# Patient Record
Sex: Male | Born: 1960
Health system: Western US, Academic
[De-identification: ages and names within clinical notes are randomized; demographics above are authoritative.]

---

## 2018-02-02 ENCOUNTER — Ambulatory Visit

## 2018-02-02 DIAGNOSIS — C61 Malignant neoplasm of prostate: Secondary | ICD-10-CM

## 2018-02-03 DIAGNOSIS — R972 Elevated prostate specific antigen [PSA]: Secondary | ICD-10-CM

## 2018-02-03 DIAGNOSIS — E291 Testicular hypofunction: Secondary | ICD-10-CM

## 2018-02-03 DIAGNOSIS — N400 Enlarged prostate without lower urinary tract symptoms: Secondary | ICD-10-CM

## 2018-02-03 NOTE — H&P
DATE OF SERVICE:  02/03/2018      Donald Perkins seeks a second opinion regarding multiple urologic issues. The patient has multiple issues that are under consideration. The main issue he is here for is elevated PSA. His PSA a year ago was 1.4. It did jump to 3.5 and then 6.5. There is no family history of prostate cancer. This occurs in the setting of significant obstructive uropathy and has been evaluated in [facility] with cystoscopy which shows trilobar hypertrophy of the prostate and a significant median lobe with moderate trabeculation of the bladder. His AUA symptom score was calculated at 28. He has been treated with a variety of medications for this including Myrbetriq and tamsulosin and terazosin. He is currently on tamsulosin. The remainder of his urologic history includes being treated for hypogonadism, although it is not clear what his initial testosterone levels were. He had been taking testosterone placement, so it coincided with his rising PSA. He has now stopped it and so were not really clear where we stand on this issue. He has also had a vasectomy in the past.    PAST MEDICAL HISTORY:  Reveals mild hypertension.    MEDICATIONS:  His current medications include Bystolic, tamsulosin, and baby aspirin.    PAST SURGICAL HISTORY:  Previous surgeries all related orthopedic injuries including a back fusion, multiple shoulder surgeries and knee surgery.    ALLERGIES:  He denies drug allergies.    SOCIAL HISTORY:  He works as a Corporate treasurer, is divorced with 2 children, and lives in [address].    REVIEW OF SYSTEMS:  His 14-point system review is noncontributory to the present illness.    PHYSICAL EXAMINATION:  He is a very physically fit muscular male with no acute distress. Vital Signs: Normal. Head, Ears, Nose and Throat: Negative. Chest: Clear to P and A. Heart: Regular sinus rhythm. Abdomen: Soft without masses. Groins and Genitalia: Normal male and prostate is symmetrical, slightly enlarged and benign. Back and Extremities, Neurologic: Intact.    IMPRESSION:  BPH with elevated prostate-specific antigen. History of male hypogonadism.    PLAN:  We had a long discussion about his PSA issues which could reflect changes in his hormone status and I told him that it would be interesting to repeat his PSA now that he is off the testosterone replacement and see whether it normalizes.  Because of the patient's significant concern about prostate cancer, I thought the interim proposal would be to do a prostate MRI which could give [place] some indication of the size and the configuration of his prostate, as well as to look for any targetable lesions which might trigger a biopsy. He is comfortable with proceeding with this and holding the biopsy decision until after repeat PSA is done or until some abnormality was seen on his MRI, he will withhold. He will continue his tamsulosin for the present time and withhold testosterone replacement for the present. We also discussed his significant obstructive uropathy. The fact that medication, medical therapy does not usually work well in patients with a median lobe hypertrophy and we will consider this issue as possible for local prostatic obstructive relief after we resolve the question of his elevated PSA. He will proceed with the MRI and we will see him and discuss results afterwards.      Gwendalyn Ege, MD [phone])        SH/MODL CONF#: [id number]  D: 02/03/2018 10:59:10 T: 02/03/2018 11:26:15 DOCUMENT: [id number]

## 2018-02-06 ENCOUNTER — Ambulatory Visit

## 2018-02-06 ENCOUNTER — Telehealth

## 2018-02-06 DIAGNOSIS — R972 Elevated prostate specific antigen [PSA]: Secondary | ICD-10-CM

## 2018-02-06 NOTE — Telephone Encounter
Request labs from [facility] [id number]

## 2018-03-05 MED ADMIN — GADOBUTROL 1 MMOL/ML IV SOLN: 10 mL | INTRAVENOUS | Stop: 2018-03-05 | NDC 50419032512

## 2018-03-25 ENCOUNTER — Ambulatory Visit: Payer: PRIVATE HEALTH INSURANCE

## 2018-03-25 DIAGNOSIS — N400 Enlarged prostate without lower urinary tract symptoms: Secondary | ICD-10-CM

## 2018-03-25 DIAGNOSIS — R972 Elevated prostate specific antigen [PSA]: Secondary | ICD-10-CM

## 2018-03-25 MED ADMIN — LIDOCAINE 2 % EX GEL UROJET: 10 mL | URETHRAL | @ 19:00:00 | Stop: 2018-03-25 | NDC 25021067377

## 2018-03-25 MED ADMIN — GENTAMICIN 40 MG/ML SYRINGE (IM): 80 mg | INTRAMUSCULAR | @ 19:00:00 | Stop: 2018-03-25 | NDC 00409120703

## 2018-03-25 NOTE — Procedures
TITLE:  PROCEDURE NOTE    DATE OF PROCEDURE:  03/25/2018         PREPROCEDURE DIAGNOSIS:  BPH.    POSTPROCEDURE DIAGNOSIS:  BPH.    TYPE OF PROCEDURE:  Cystourethroscopy.    ASSISTANT:  None.    ANESTHESIA:  Topical anesthesia.    INDICATIONS:  This is a 57 year old male who has been evaluated for both hypogonadism, elevated PSA, and obstructive BPH. His testosterone is at good levels with replacement. His MRI suggests no targetable or suspicious areas for prostate cancer and a prostate of 45 g. He has moderate obstructive uropathy symptoms.    FINDINGS:  There was moderately enlarged prostate with no significant median lobe component. Minimal trabeculation of the bladder.    DESCRIPTION OF PROCEDURE:  With the patient in supine position on the cysto table, the penis was prepared and draped in standard technique. Intraurethral lidocaine was used, and the Storz flexible scope was inserted in retrograde fashion. The bladder, bladder neck, and urethra were examined under direct vision. The bladder was of normal capacity, normally distensible, without significant trabeculation. The trigone was well developed. There was moderate trilobar hypertrophy of the prostate with a relatively short prostatic urethra. No lesions being seen, the anterior urethra was normal, and the scope was removed. The bladder was drained. Prophylactic gentamicin was given, and the patient was taken to the holding area having tolerated the procedure well. The patient is a reasonable candidate for transurethral prostatectomy or other transurethral procedure for relief of   BPH symptoms. We will discuss various options with him and make appropriate referral.      Christinia Gully, MD [phone])        SH/MODL CONF#: [id number]  D: 03/25/2018 10:57:24 T: 03/25/2018 13:15:12 DOCUMENT: [id number]

## 2018-03-25 NOTE — Interdisciplinary
Patient states that oral sedation was not taken before coming in for a procedure. Designated driver is n/a and can be reached at n/a

## 2018-07-24 ENCOUNTER — Ambulatory Visit: Payer: PRIVATE HEALTH INSURANCE

## 2018-07-24 DIAGNOSIS — R972 Elevated prostate specific antigen [PSA]: Secondary | ICD-10-CM

## 2018-07-27 ENCOUNTER — Ambulatory Visit: Payer: PRIVATE HEALTH INSURANCE

## 2018-08-12 ENCOUNTER — Ambulatory Visit: Payer: PRIVATE HEALTH INSURANCE

## 2018-09-02 ENCOUNTER — Ambulatory Visit: Payer: PRIVATE HEALTH INSURANCE

## 2018-11-05 ENCOUNTER — Ambulatory Visit: Payer: PRIVATE HEALTH INSURANCE

## 2018-11-05 DIAGNOSIS — R972 Elevated prostate specific antigen [PSA]: Secondary | ICD-10-CM

## 2018-11-16 ENCOUNTER — Telehealth: Payer: PRIVATE HEALTH INSURANCE

## 2018-11-16 ENCOUNTER — Ambulatory Visit: Payer: PRIVATE HEALTH INSURANCE

## 2018-11-16 NOTE — H&P
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 Full evaluation including cystoscopy, revealed a significantly obstructive uropathy with elevated residual urine. After the completion of the evaluation, it was recommended that he either go on full medical therapy or have a transurethral procedure to relieve outlet obstruction. The patient chose to continue taking tamsulosin and was also taking supplementary testosterone. Recently, his PSA was noted to be 1.5 and his testosterone level to be 1099. The patient on Telemedicine consult today reveals that he is having quite significant obstructive uropathic symptoms with multi-time nocturia. He is not particularly reliable in taking his medications and takes them somewhat irregularly and in the wrong sequence. He was surprised to know that his testosterone level was so high. The bottom line was we had a long discussion. I told him that he should cut way back on his testosterone supplementation. He should continue taking tamsulosin and he should probably also go on finasteride, which he declines to do. I do think that he would benefit from interventional volume reduction of his prostate and then he would not have to worry so much about his medications. He was also relieved to note that his PSA has been 1.5 on 2 separate occasions. I have taken the liberty to refer him to my colleague, Dr. Geryl Councilman, for consideration of prostatectomy and he wishes to do a video consultation, which I will attempt to arrange.      Christinia Gully, MD [phone])        SH/MODL CONF#: [id number]  D: 11/16/2018 13:59:05 T: 11/16/2018 14:48:03 DOCUMENT: [id number]

## 2018-11-16 NOTE — Telephone Encounter
Hello,     Dr. Barnet Pall would like to refer this pt to see Dr. Geryl Councilman for TURP.     Thank you.

## 2018-11-17 NOTE — Telephone Encounter
LVM to schedule pt for NP video Sx consult with Dr. Geryl Councilman

## 2018-11-23 ENCOUNTER — Telehealth: Payer: PRIVATE HEALTH INSURANCE | Attending: Surgical Oncology

## 2018-11-23 MED ORDER — TAMSULOSIN HCL 0.4 MG PO CAPS
.4 mg | ORAL_CAPSULE | Freq: Two times a day (BID) | ORAL | 3 refills | Status: AC
Start: 2018-11-23 — End: ?

## 2018-11-25 NOTE — Progress Notes
58 year old patient of Dr. Barnet Pall.     History of potentially elevated PSA but recent 1.5 and normal DRE.     Has obstructive LUTS on tamsulosin 0.4 mg. N2-4x, urge, occasional UI. 45 cc on pMRI, trilobar hypertrophy on cystoscopy by Dr. Barnet Pall. PVR reportedly low. Symptoms progressively worse last 6-8 years.     Sexually active has younger gf. Divorced with two children. Sexual function is important to him.    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 Last 1099.    Impression:  1. 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 T replacement for hypogonadism  3. Prior vasectomy.  4. cialis prn    Discussion:  1. We reviewed and discussed options including maximal medical therapy vs surgery which would include TURP. We discussed risks, benefits, and typical course of TURP. 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  2. At this point, the patient has not been consistently taking medication. He is adverse to finasteride for concern of sexual function. We discussed that if he is compliant with medical therapy, to try tamsulosin bid and cialis 5 mg qd.   3. If wishes to stop medications and or worsening LUTS, to consider TURP.  4. Advised to decrease T replacement to keep at least high normal range  5. Will follow up prn

## 2018-12-02 ENCOUNTER — Ambulatory Visit: Payer: PRIVATE HEALTH INSURANCE

## 2019-09-02 ENCOUNTER — Ambulatory Visit: Payer: PRIVATE HEALTH INSURANCE

## 2019-09-02 DIAGNOSIS — Z23 Encounter for immunization: Secondary | ICD-10-CM

## 2019-09-16 ENCOUNTER — Ambulatory Visit: Payer: PRIVATE HEALTH INSURANCE

## 2019-09-16 DIAGNOSIS — R972 Elevated prostate specific antigen [PSA]: Secondary | ICD-10-CM

## 2019-09-22 ENCOUNTER — Telehealth: Payer: PRIVATE HEALTH INSURANCE

## 2019-09-22 DIAGNOSIS — N400 Enlarged prostate without lower urinary tract symptoms: Secondary | ICD-10-CM

## 2019-09-22 NOTE — Progress Notes
Note dictated

## 2019-09-22 NOTE — Telephone Encounter
PDL Call to Practice    Reason for Call: Pt called in and needed to speak to [facility] in regards to prescriptions discussed in his video visit today.  MD: Barnet Pall     Appointment Related?  []  Yes  [x]  No     If yes;  Date:  Time:    Call warm transferred to PDL: [x]  Yes  []  No    Call Received by Practice Representative:  Colletta [place]

## 2019-09-22 NOTE — H&P
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 He was initially referred for elevated PSA, which, in fact, he did not have and does not have at the present time. His current PSA is 1.5 and his testosterone level is 666. He has been treated intermittently for obstructive uropathy with tamsulosin, which, when he takes it, has great results. He was referred for possible surgery, but it was felt that if he complied with his medications, he would not need surgery at this time, an opinion with which I agree. We had a long talk about taking the medication and complying with it. He will attempt to do so. He is apparently moving to the [facility] and will establish urological care when he gets there. He will return here p.r.n.      Christinia Gully, MD [phone])        SH/MODL CONF#: [id number]  D: 09/22/2019 12:38:52 T: 09/22/2019 12:54:59 DOCUMENT: [id number]

## 2019-12-11 MED ORDER — TADALAFIL 5 MG PO TABS
ORAL_TABLET | 5 refills
Start: 2019-12-11 — End: ?

## 2019-12-18 MED ORDER — TADALAFIL 5 MG PO TABS
ORAL_TABLET | 5 refills | Status: AC
Start: 2019-12-18 — End: ?

## 2021-08-11 IMAGING — CT CT ABD/PEL W CONT
2 of 5 series · 13 of 46 positions shown, 15 images · IV contrast (isovue)
Comparison: None

HISTORY/INDICATION:  Left lower quadrant abdominal pain
TECHNIQUE: Scans of the abdomen and pelvis are performed at 5mm increments, both with oral contrast and intravenous administration of 100 mL of Isovue 300 with coronal and sagittal reconstructions. I-STAT creatinine is 1.3 mg/dL. 

Dose reduction technique used: Automated exposure control and adjustment of the mA and/or kV according to patient size. CT studies and Cardiac Nuclear Medicine studies last 12 months = 0
Total radiation dose to patient is CTDIvol 29.29 mGy and DLP 921.60 mGy-cm.

[Series 4: soft tissue · axial · 0.66mm/px · z∈[+1247,+1687]mm · 10 of 100 slices shown, 12 images]
[im 6/100  soft-tissue]
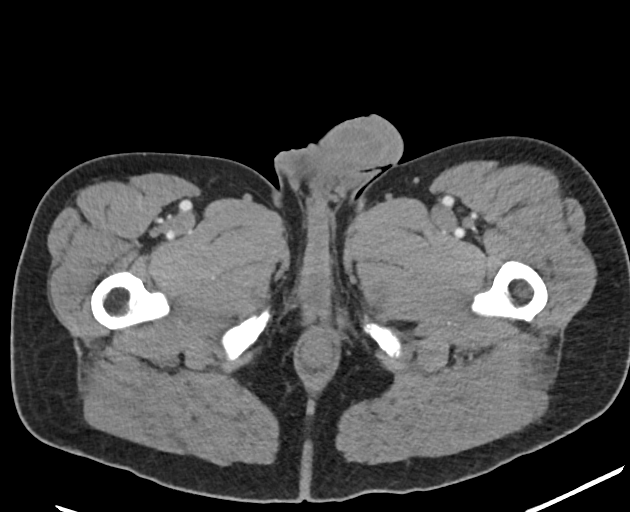
[im 6/100  bone]
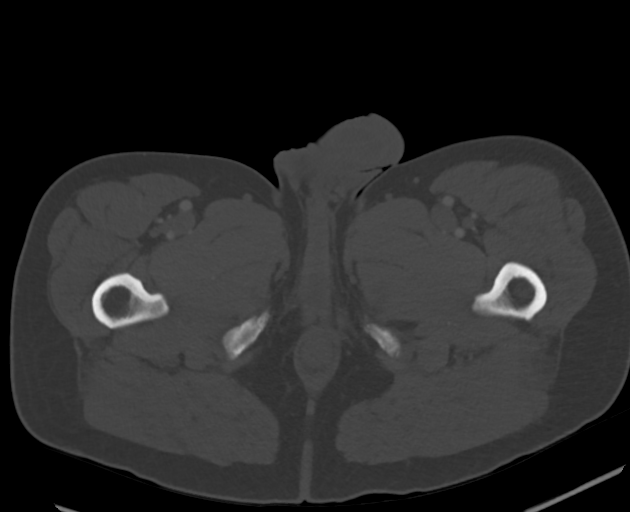
[im 16/100  soft-tissue]
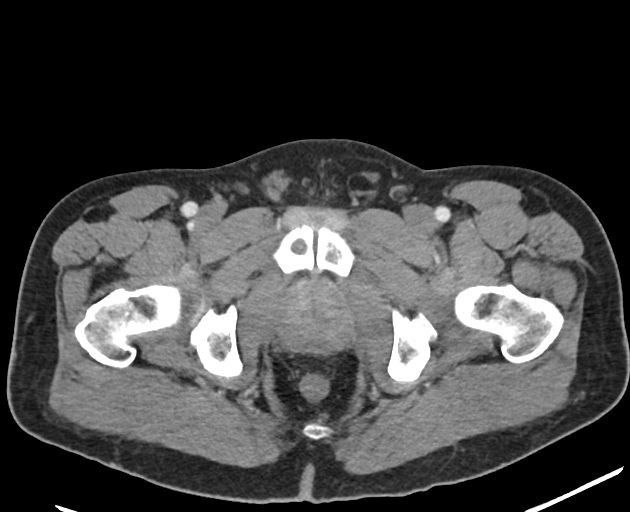
[im 27/100  soft-tissue]
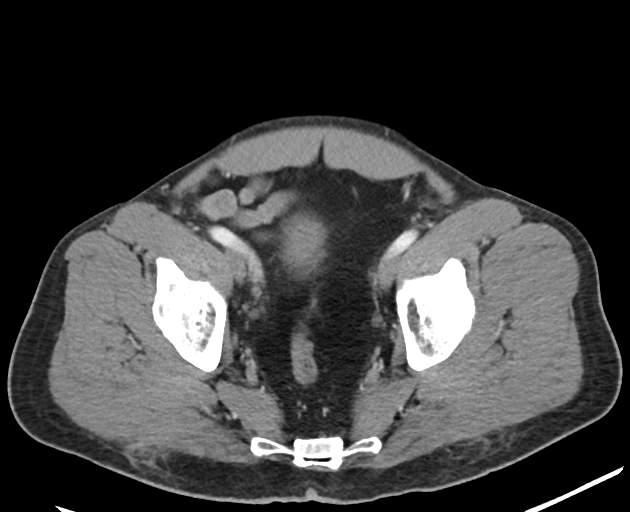
[im 37/100  soft-tissue]
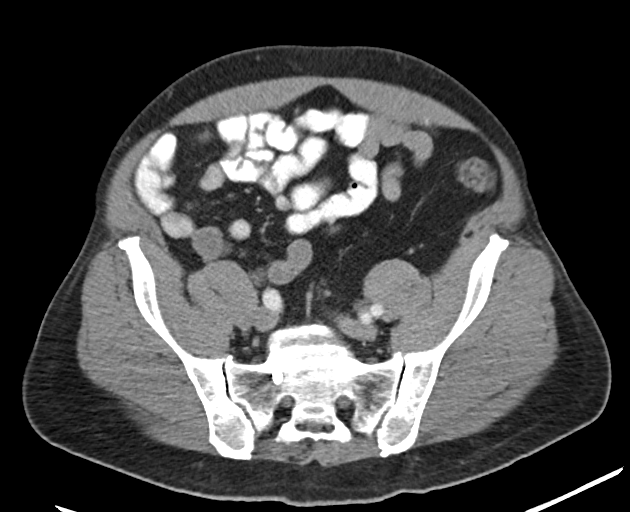
[im 47/100  soft-tissue]
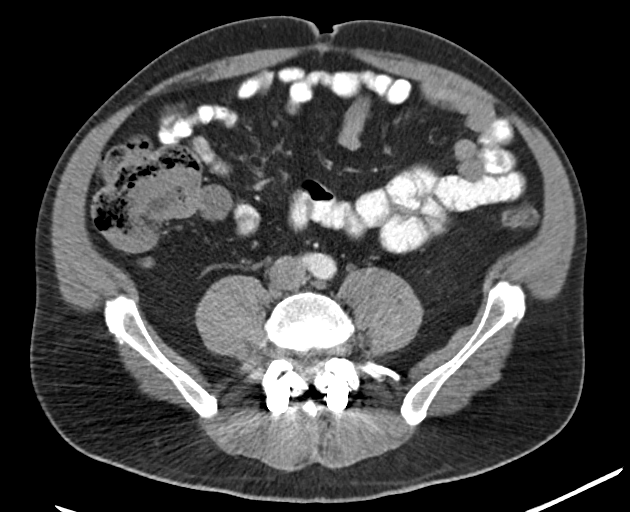
[im 53/100  soft-tissue]
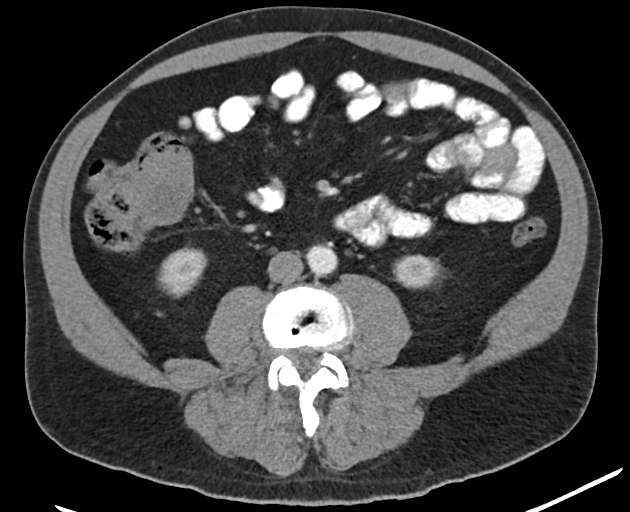
[im 63/100  soft-tissue]
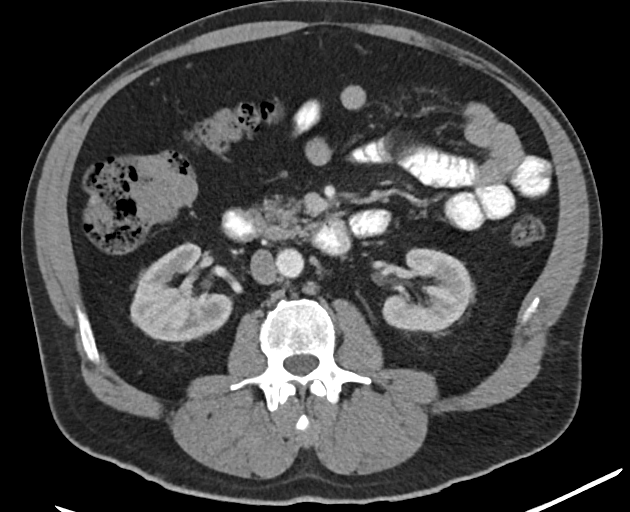
[im 73/100  soft-tissue]
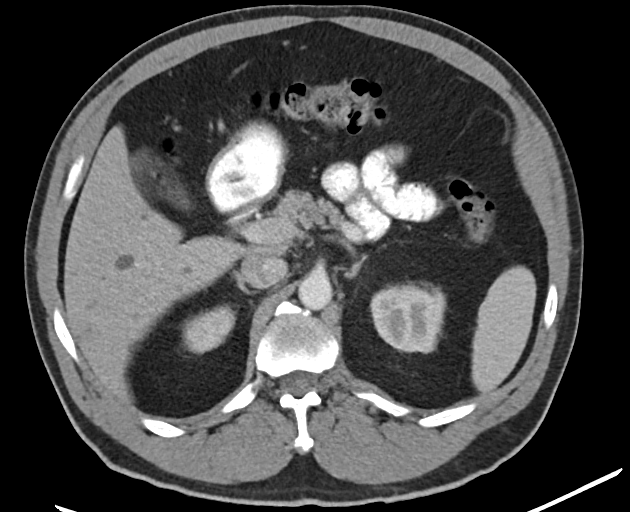
[im 84/100  soft-tissue]
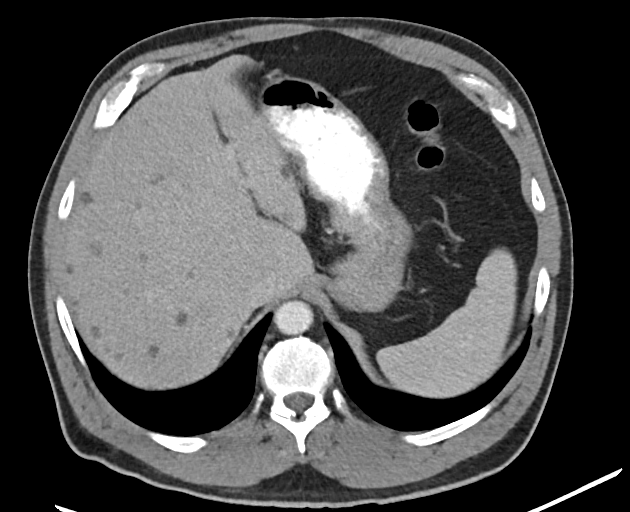
[im 84/100  bone]
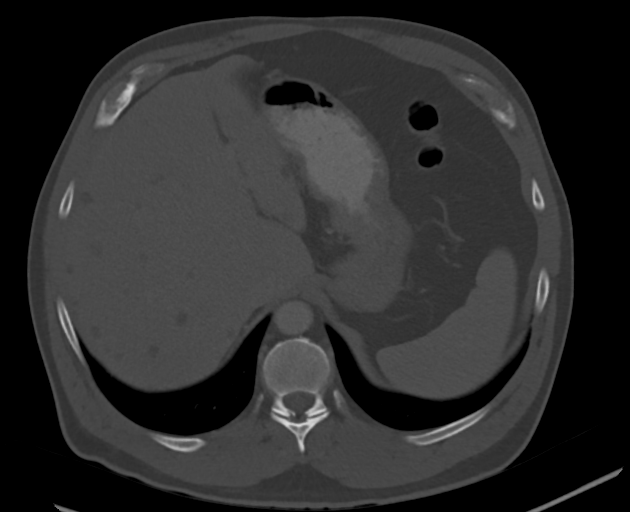
[im 94/100  soft-tissue]
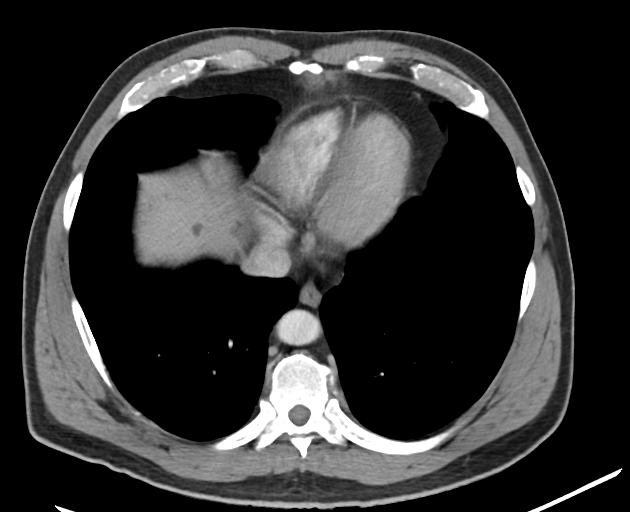

[Series 6: coronal · coronal · 0.81mm/px · 3 of 67 slices shown]
[im 23/67  soft-tissue]
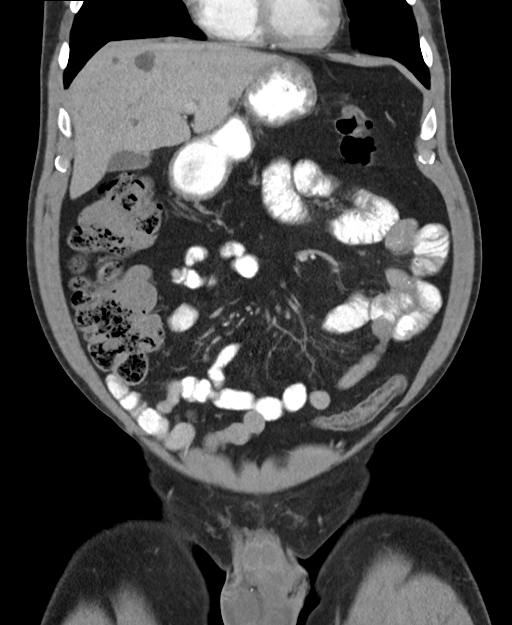
[im 30/67  soft-tissue]
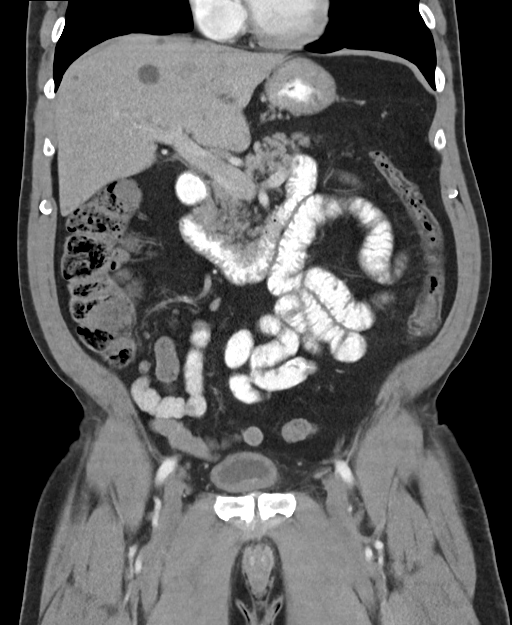
[im 37/67  soft-tissue]
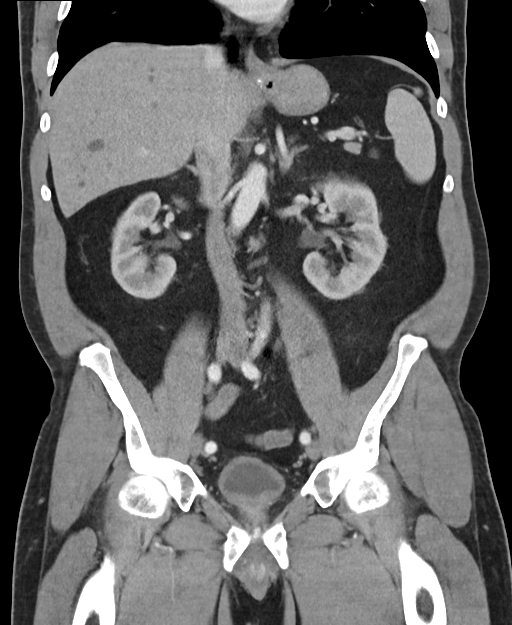

[13 of 46 positions shown; findings below may reference images not displayed]

FINDINGS: CT ABDOMEN: Lung bases are clear. Caudal margin of heart is normal. No hiatal hernia is present although postsurgical changes appear to be present at the gastroesophageal junction.

Numerous simple cysts are seen scattered uniformly through the liver, generally 5 mm or less in diameter although a segment VI cyst ([DATE]) measures 25 x 18 mm. No solid hepatic mass, fatty infiltration or cirrhosis is present. The gallbladder and biliary system, spleen, pancreas, adrenal glands and kidneys are normal. The periaortic and paracaval spaces are normal and no intraperitoneal findings are seen in the upper or midabdomen.

CT PELVIS: The right colon including terminal ileum and appendix is normal. There is a moderate amount of inspissated stool in the colon with scattered diverticula in the sigmoid colon with no evidence of diverticulitis or colitis. The right and left ureters are normal to the level of the bladder with no obstructing ureteral stone. Bladder wall is mildly thickened and there are postsurgical changes of TURP. No inguinal findings are present.

Coronal and sagittal reconstructions show no additional findings. Degenerative change is seen in disc spaces and facets of lower lumbar spine with bilateral L5 spondylolysis stabilized by pedicle screws and connecting rods between L5 and S1 without spondylolisthesis.
IMPRESSION: 1.
Numerous simple hepatic cysts.

2.
Moderate constipation with diverticulosis of sigmoid colon.

3.
Postop TURP with mild bladder wall thickening which should be correlated for cystitis.

4.
Bilateral L5 spondylolysis stabilized by pedicle screws and connecting rods between L5 and S1 without spondylolisthesis.

## 2021-11-24 IMAGING — MR MRI KNEE RT WO CONTRAST
4 of 5 series · 23 of 40 positions shown · non-contrast
Comparison: None.

Images Obtained from [facility]
INDICATION: Pain in right knee.
TECHNIQUE: Multiplanar, multiecho imaging of the right knee was performed, including T1-weighted and fluid sensitive sequences without intravenous contrast administration.

[Series 8: t2_axial_fs · axial · right · 3.0mm · 0.52mm/px · z∈[-25,+99]mm · 8 of 32 slices shown]
[im 1/32]
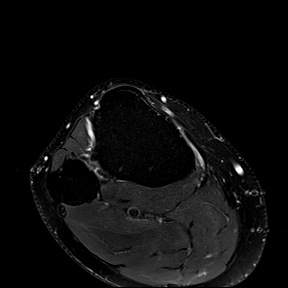
[im 5/32]
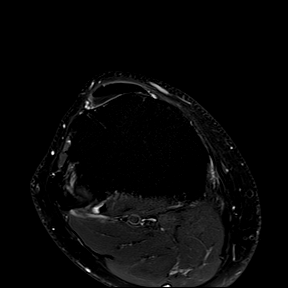
[im 9/32]
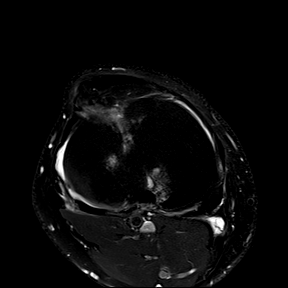
[im 14/32]
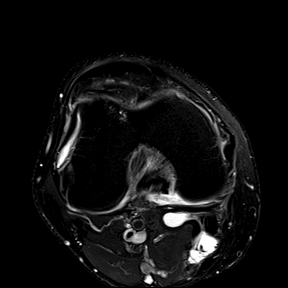
[im 18/32]
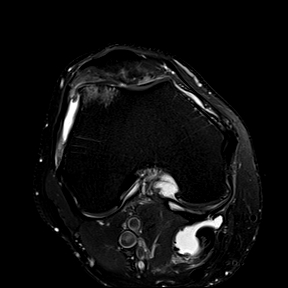
[im 23/32]
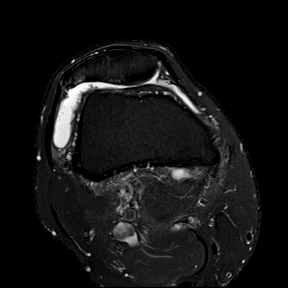
[im 27/32]
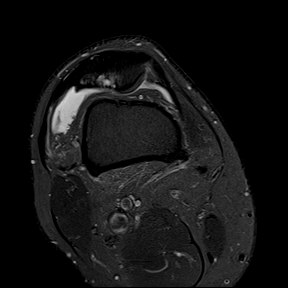
[im 32/32]
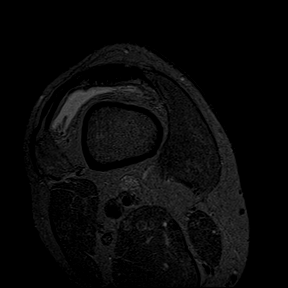

[Series 9: pd_sag_fs · sagittal · right · 3.0mm · 0.27mm/px · 8 of 30 slices shown]
[im 1/30]
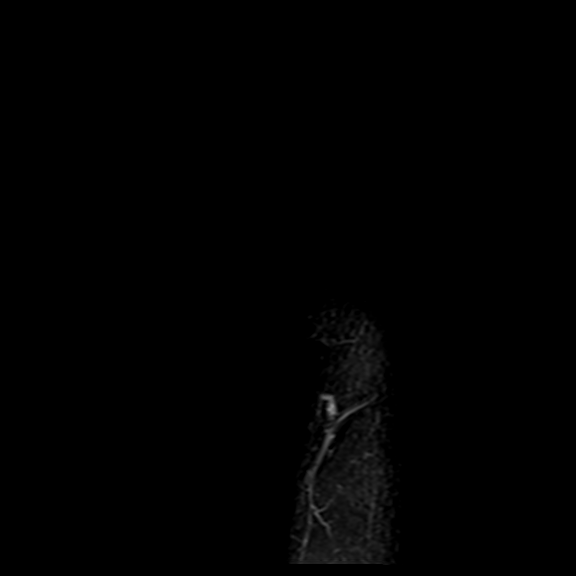
[im 5/30]
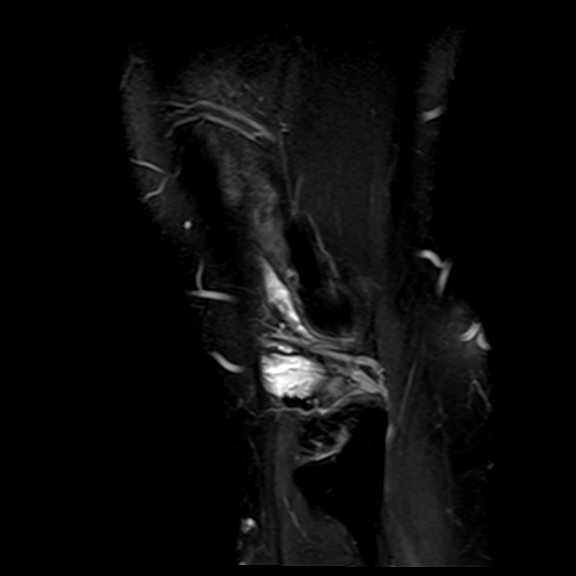
[im 9/30]
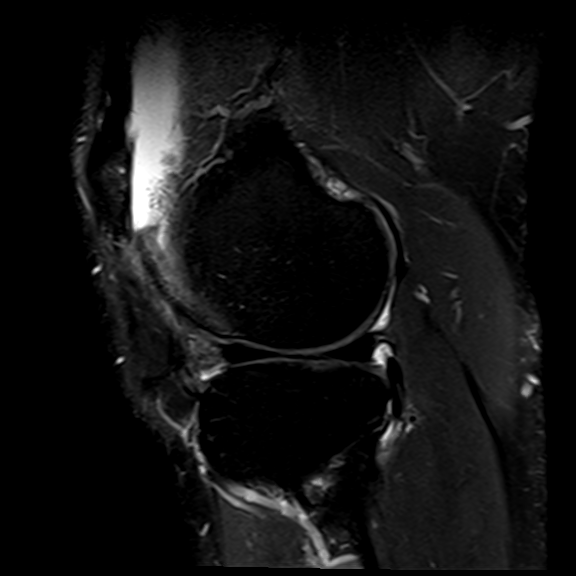
[im 13/30]
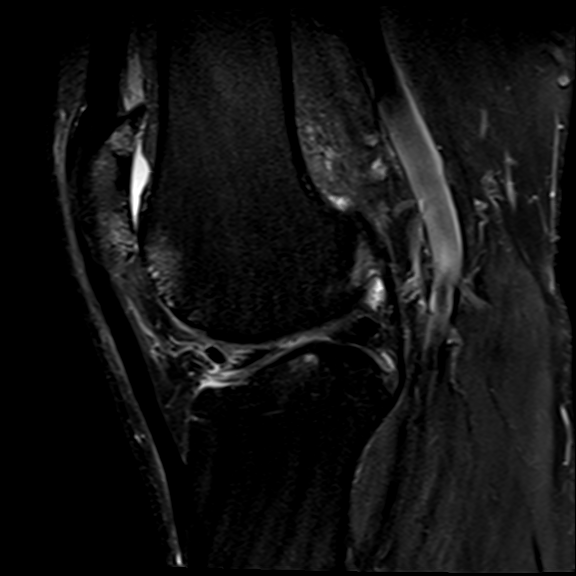
[im 17/30]
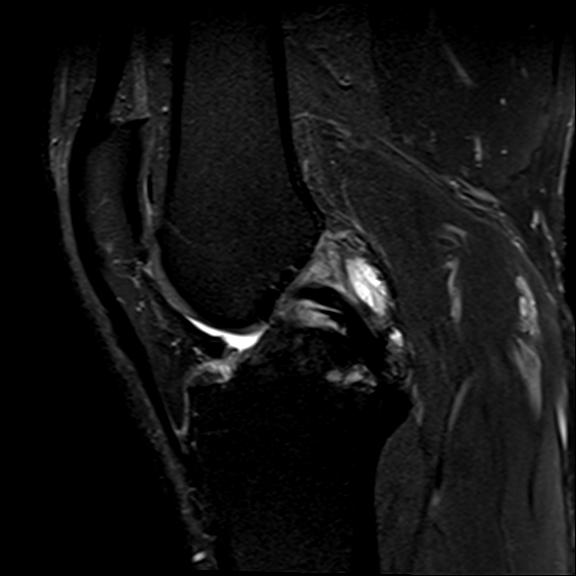
[im 21/30]
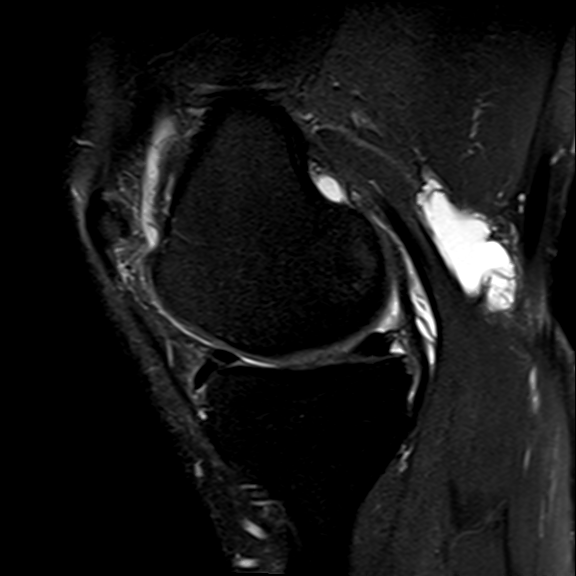
[im 25/30]
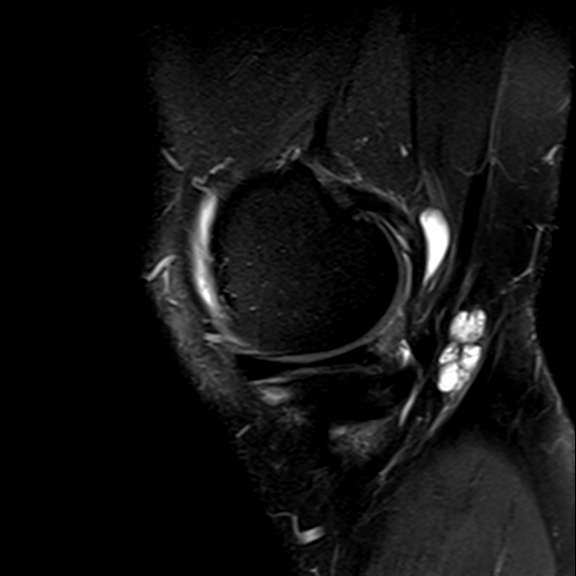
[im 30/30]
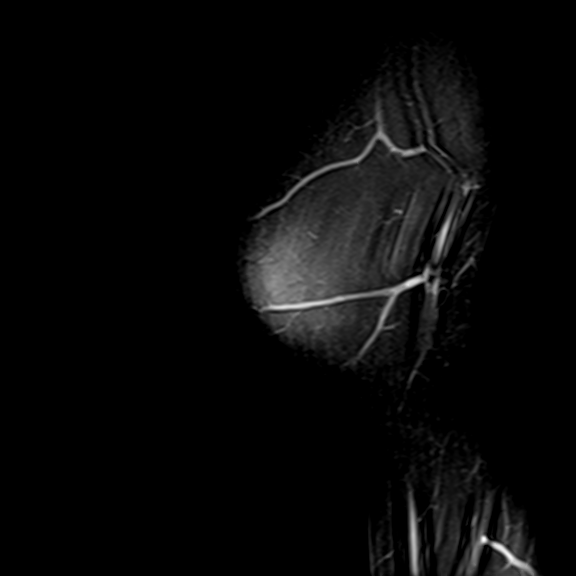

[Series 10: t2_sag_fs · sagittal · right · 3.0mm · 0.27mm/px · 4 of 30 slices shown]
[im 1/30]
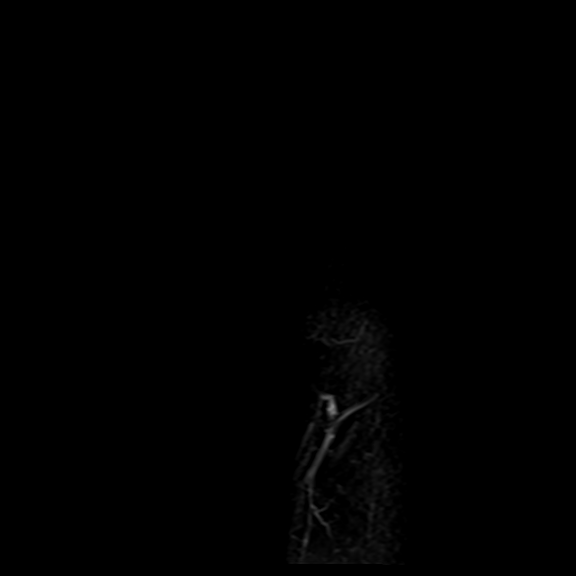
[im 5/30]
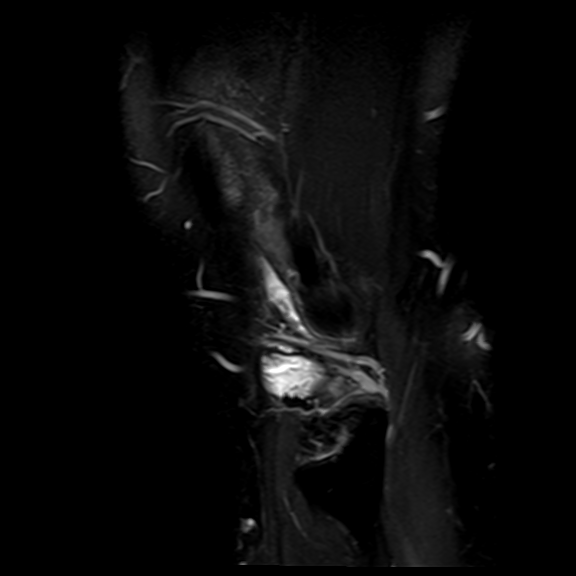
[im 17/30]
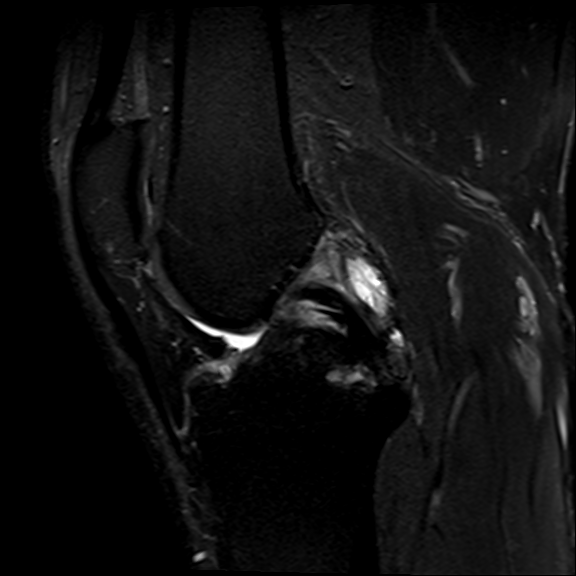
[im 25/30]
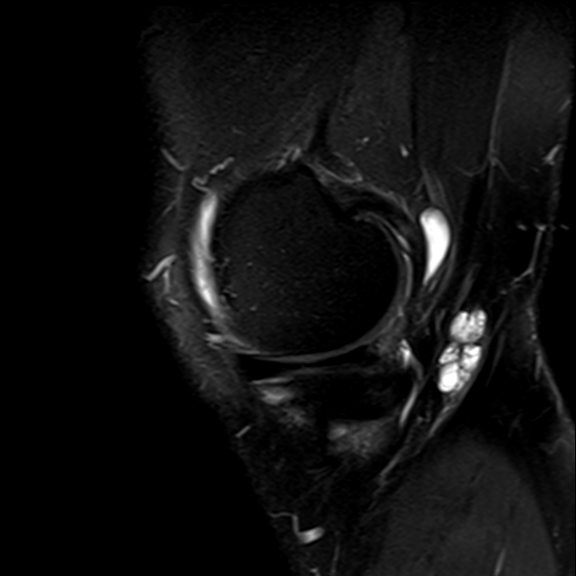

[Series 11: t1_cor · coronal · right · 3.5mm · 0.46mm/px · 3 of 30 slices shown]
[im 5/30]
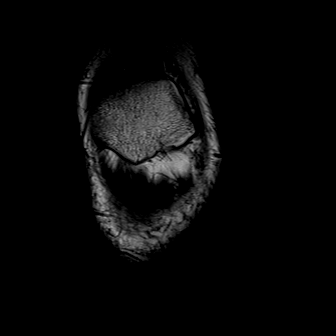
[im 17/30]
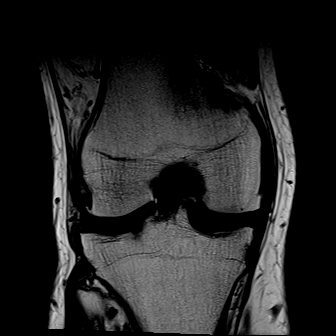
[im 25/30]
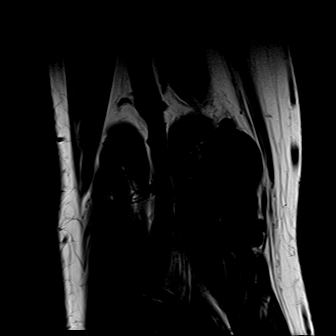

[23 of 40 positions shown; findings below may reference images not displayed]

FINDINGS: MEDIAL MENISCUS:  Moderate-sized radial tear at the junction of the posterior horn/root. Large oblique undersurface tear at the junction of the posterior horn and body with undersurface flap
extending into the inferior medial gutter.
LATERAL MENISCUS:  Subtle fraying at the free edge of the body.
ACL:  Intact.
PCL:  Intact.
MCL:  Chronic partial-thickness tear, at the proximal attachment.
LATERAL LIGAMENTS AND TENDONS:  Intact.
EXTENSOR MECHANISM:  The quadriceps and patellar tendons are intact.
FAT PADS:   Scar tissue and edema present in Hoffa's fat.
CARTILAGE:
Patellofemoral compartment:  Extensive full-thickness chondral loss at the lateral femoral trochlea and lateral patellar facet, as well as the patellar apex.
Medial compartment:  Scattered chondral surface irregularity and thinning. High-grade chondral loss at the inner aspect of the tibial plateau.
Lateral compartment: Large high-grade chondral loss at the inner aspect of the tibial plateau. Scattered chondral surface irregularity and thinning throughout the weightbearing femoral condyle.
BONE MARROW: Subchondral marrow edema, most pronounced at the anterior compartment. Small marginal osteophytes. No acute fracture. Mild subluxation laterally of the patella.
Moderate-sized Baker's cyst. Moderate-sized joint effusion with synovitis/debris.
IMPRESSION: 1.\X09\Moderate-sized radial tear at the junction of the posterior horn/root medial meniscus. Large oblique undersurface tear at the junction of the posterior horn and body of medial meniscus, with
undersurface flap extending into the inferior medial gutter.
2.\X09\Subtle fraying at the free edge of the lateral meniscus body.
3.\X09\Severe patellofemoral compartment chondral abnormalities.
4.\X09\Moderate medial and lateral compartment chondral abnormalities.
5.\X09\Chronic partial-thickness tear of the MCL, at the proximal attachment.

## 2021-12-14 IMAGING — CR CHEST 2 VWS PA LAT
1 series · 2 of 2 positions shown · non-contrast
Comparison: None

Images Obtained from [facility]
HISTORY: Encounter for other preprocedural examination
TECHNIQUE: PA and lateral chest radiographs

[Series 1: PA · 0.17mm/px · 2 of 2 slices shown]
[im 1/2]
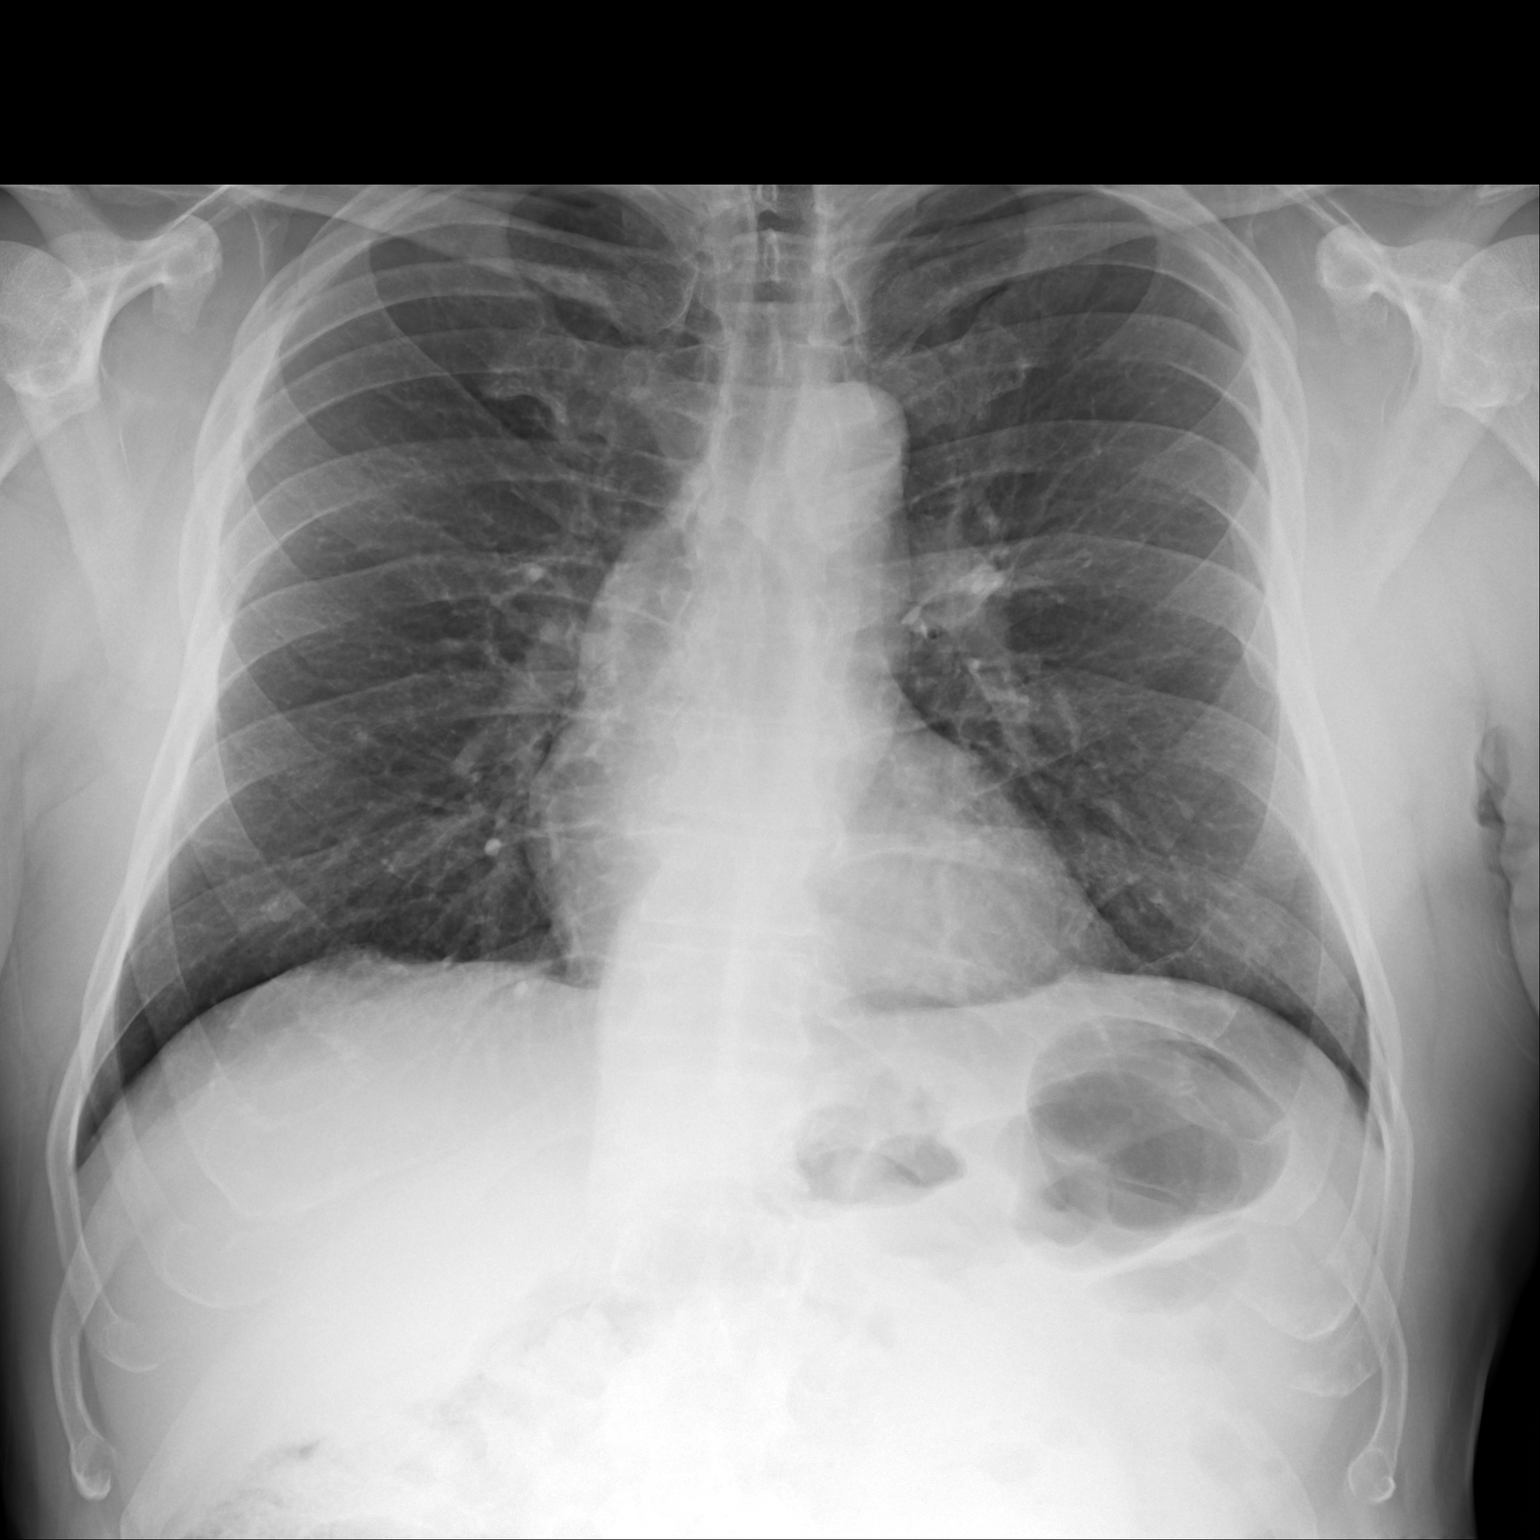
[im 2/2]
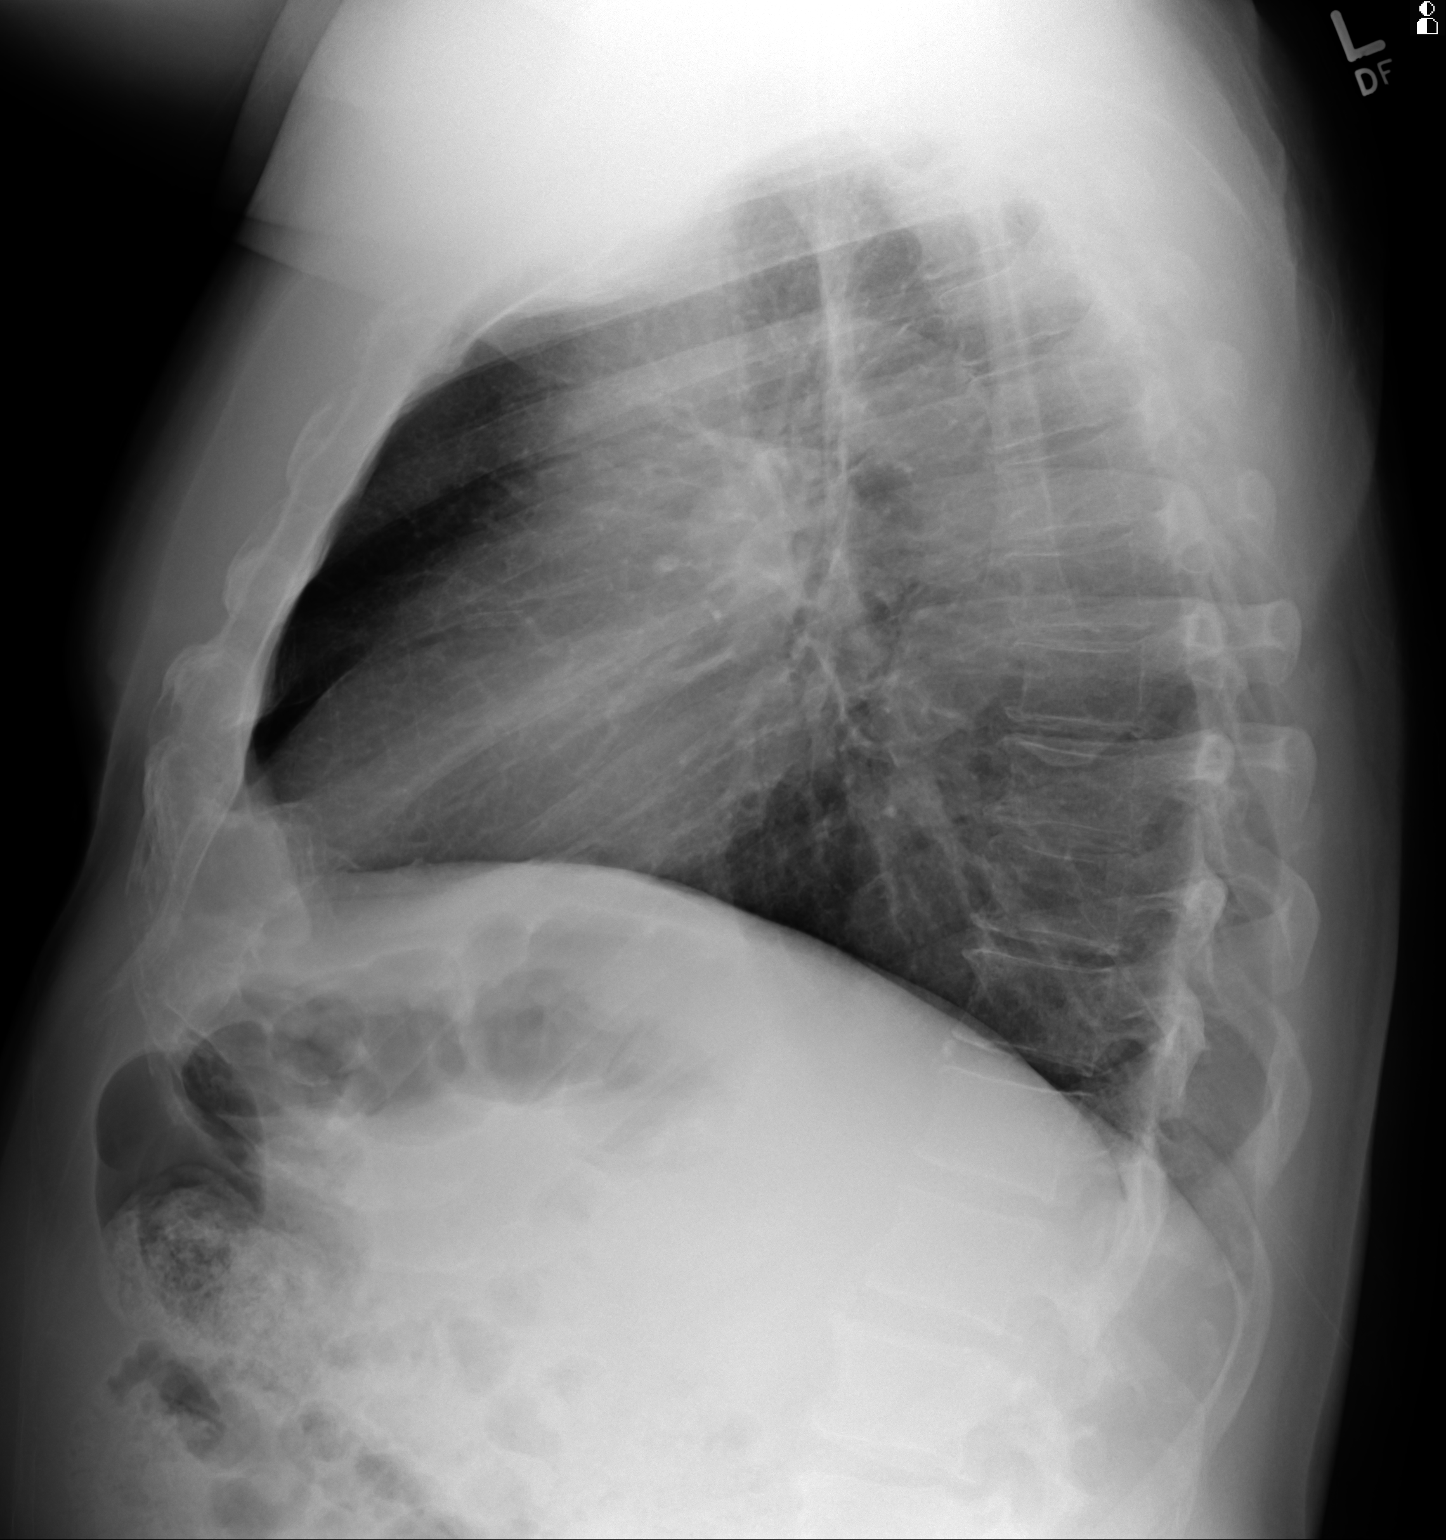

[2 of 2 positions shown; findings below may reference images not displayed]

FINDINGS: Heart size is normal. No pleural effusion or pneumothorax. No consolidation.
IMPRESSION: No acute chest findings.

## 2021-12-25 IMAGING — MR MRI LSPINE WO CONTRAST
4 of 5 series · 20 of 48 positions shown · non-contrast
Comparison: None.

Images Obtained from [facility]
HISTORY: 61-year-old male. Low back pain.  Right leg radiculopathy.
TECHNIQUE: Multiplanar, multi-sequential MR images of the lumbar spine were obtained without intravenous contrast.

[Series 16: t2_sag · sagittal · 4.0mm · 0.55mm/px · 6 of 16 slices shown]
[im 1/16]
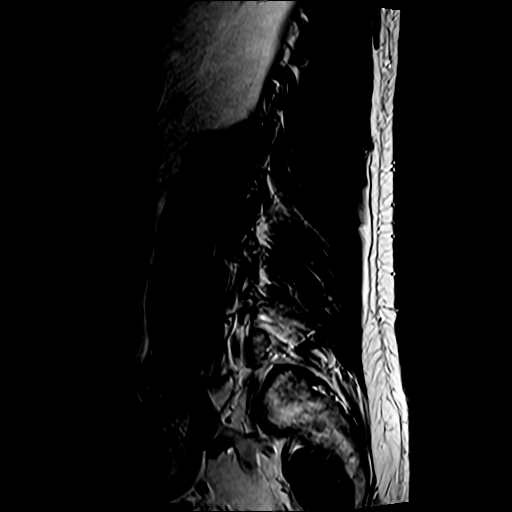
[im 4/16]
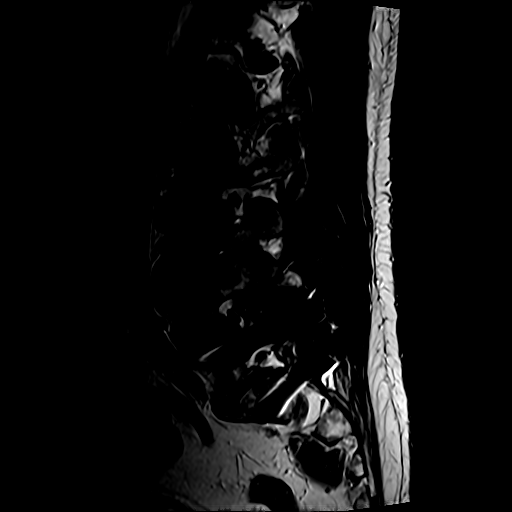
[im 7/16]
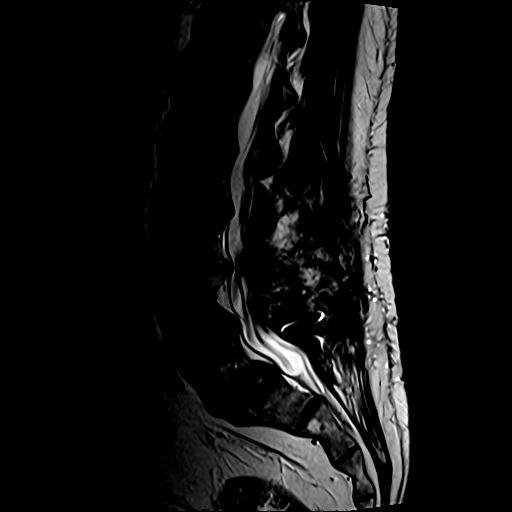
[im 10/16]
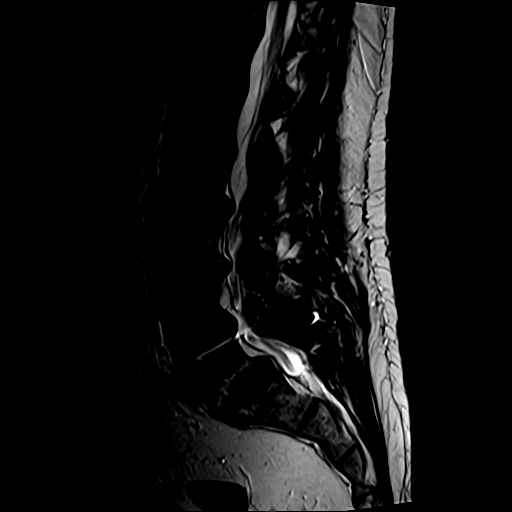
[im 13/16]
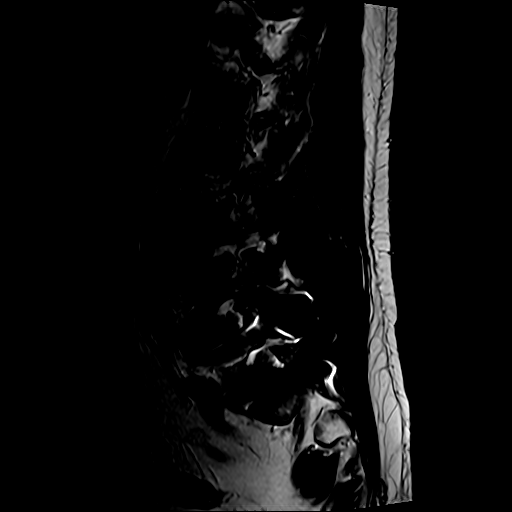
[im 16/16]
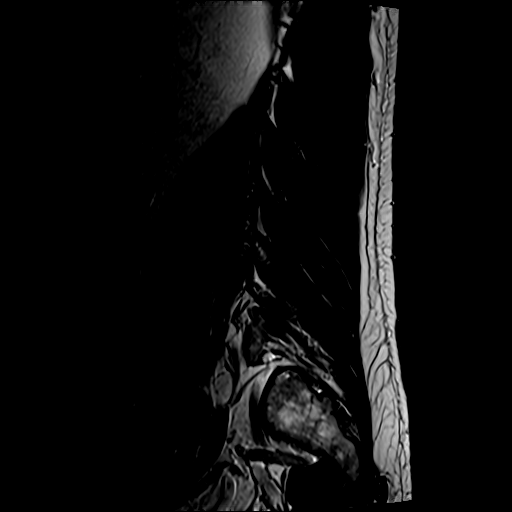

[Series 17: t1_sag · sagittal · 4.0mm · 0.55mm/px · 7 of 16 slices shown]
[im 1/16]
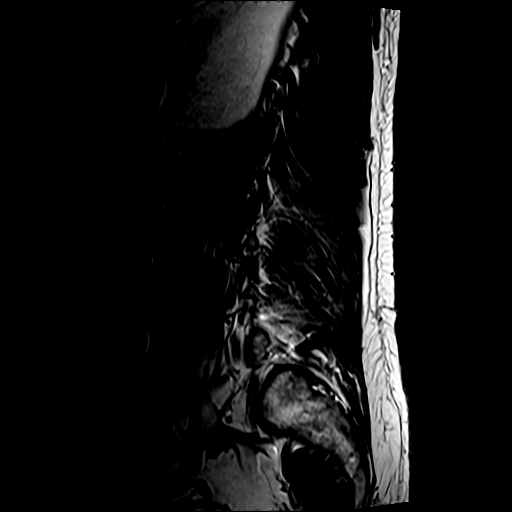
[im 3/16]
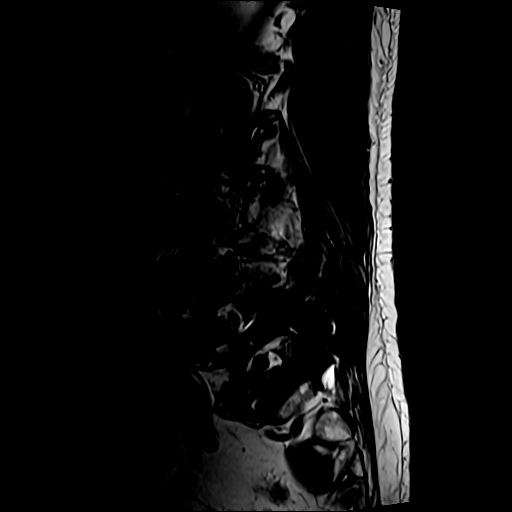
[im 6/16]
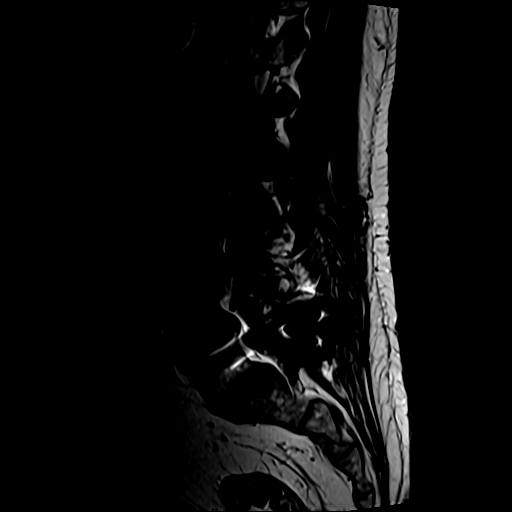
[im 8/16]
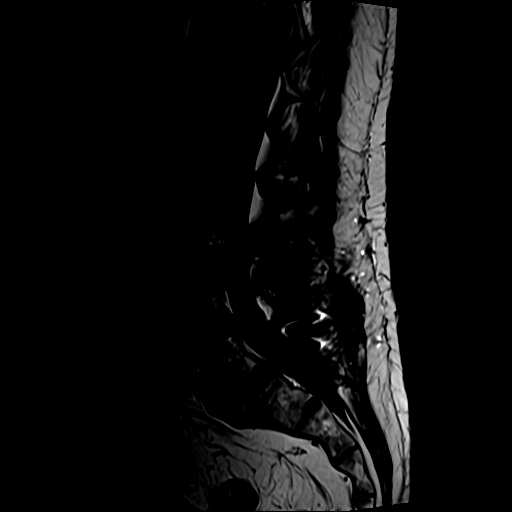
[im 11/16]
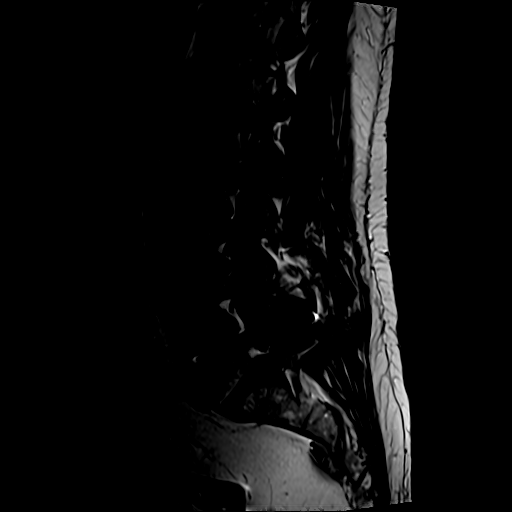
[im 13/16]
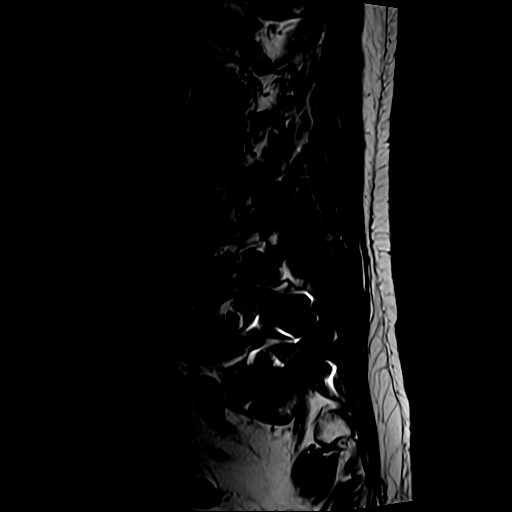
[im 16/16]
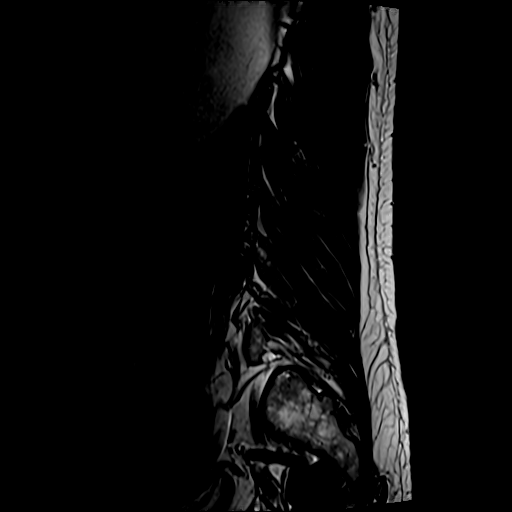

[Series 18: ir_sag · sagittal · 4.0mm · 0.55mm/px · 4 of 16 slices shown]
[im 1/16]
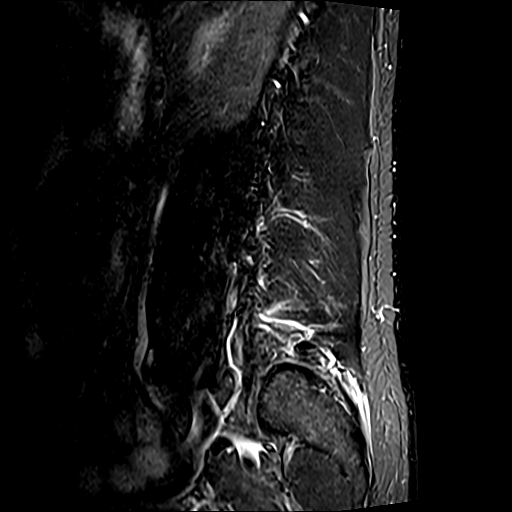
[im 3/16]
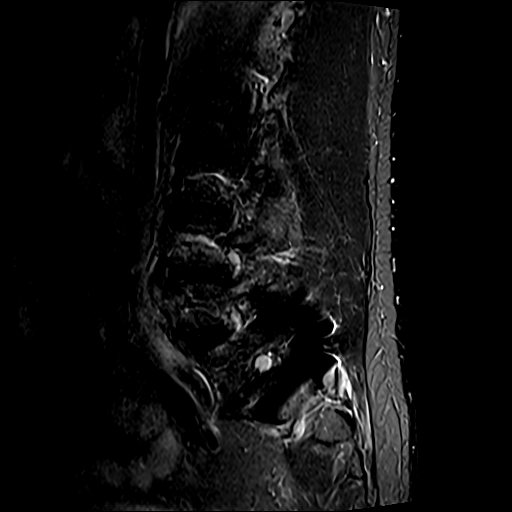
[im 8/16]
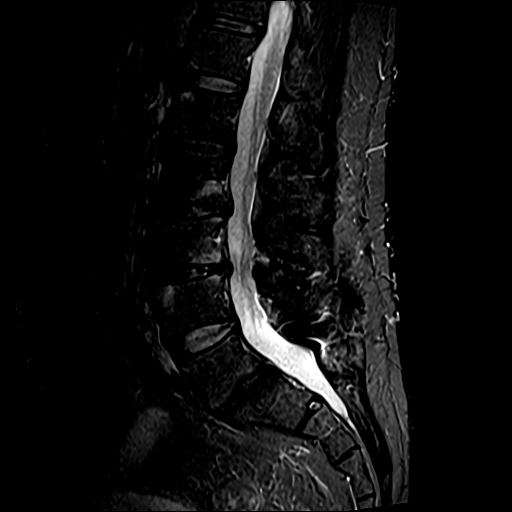
[im 13/16]
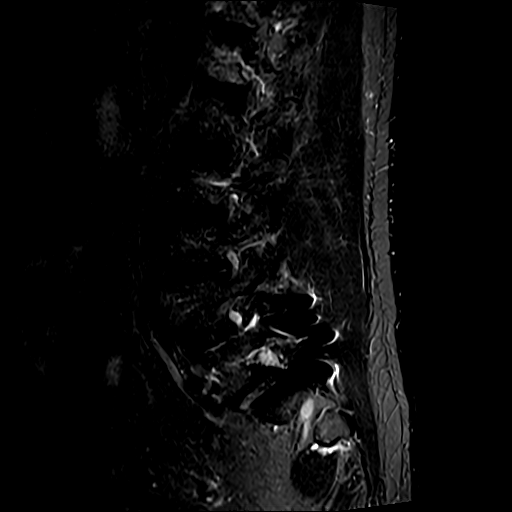

[Series 19: t2_axial · axial · 4.0mm · 0.62mm/px · z∈[-495,-346]mm · 3 of 42 slices shown]
[im 6/42]
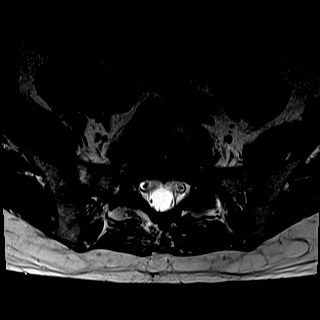
[im 21/42]
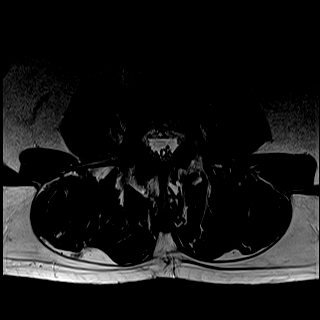
[im 36/42]
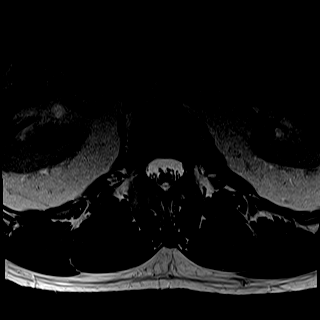

[20 of 48 positions shown; findings below may reference images not displayed]

FINDINGS: Spine labeling: Based on counting caudally using the whole spine localizer.
Postsurgical changes: Posterior lumbar interbody fusion at L5-S1. Right hemilaminectomy at L3. Bilateral laminectomies at L5.
Alignment: Shallow rightward curvature centered at L1. Grade 1 anterolisthesis of L5 on S1.
Vertebrae and discs: Multilevel degenerative disc disease and discogenic endplate changes.
Marrow: No suspicious osseous lesion or acute fracture.
Conus: Ends at a normal level. Unremarkable cauda equina nerve roots.
T12-L1: No significant canal or neural foraminal stenosis.
L1-L2: Mild disc bulge. No significant canal or neural foraminal stenosis.
L2-L3: Mild disc bulge. Mild ligamentum flavum thickening and facet arthrosis. Mild canal and mild left lateral recess stenoses. No significant neural foraminal stenosis.
L3-L4: Mild disc bulge. Moderate ligamentum flavum thickening and facet arthrosis. Mild canal, severe right, and moderate left neural foraminal stenoses. Possible impingement of the exiting right L3
nerve root.
L4-L5: Mild disc bulge. Moderate to severe facet arthrosis. No significant canal stenosis. Mild bilateral neural foraminal stenoses.
L5-S1: Postsurgical level. No significant canal or neural foraminal stenosis.
Moderate degenerative changes of the sacroiliac joints.
IMPRESSION: 1.  Severe right L3-L4 neural foraminal stenosis with possible impingement of the exiting right L3 nerve root.
2.  Moderate left L3-L4 neural foraminal stenosis.
3.  No high-grade canal stenosis.
4.  Postsurgical changes at L5-S1.

## 2022-04-13 IMAGING — MR MRI KNEE LT WO CONTRAST
4 of 5 series · 18 of 40 positions shown · non-contrast
Comparison: None available.

Images Obtained from [facility]
INDICATION: lt knee pain
TECHNIQUE: Multiplanar, multisequence MR imaging of the left knee was performed without contrast.

[Series 5: t2_axial_fs · axial · left · 3.0mm · 0.42mm/px · z∈[-71,+51]mm · 8 of 32 slices shown]
[im 1/32]
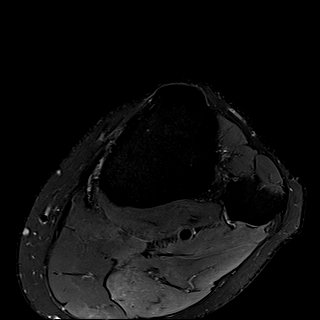
[im 4/32]
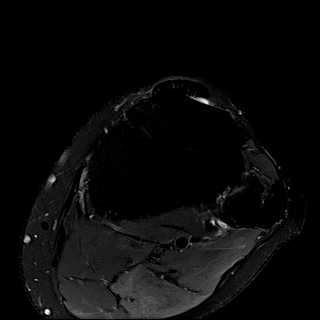
[im 11/32]
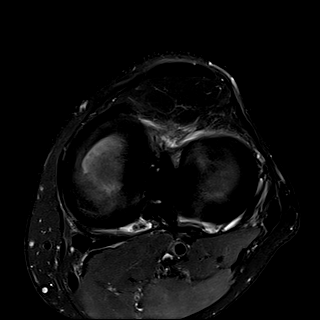
[im 14/32]
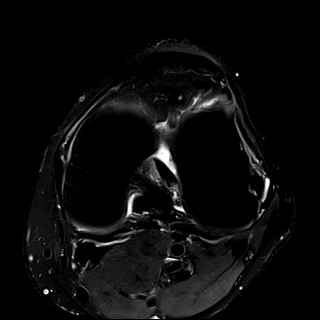
[im 18/32]
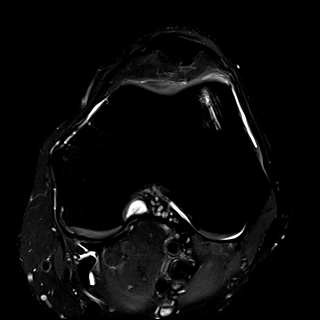
[im 21/32]
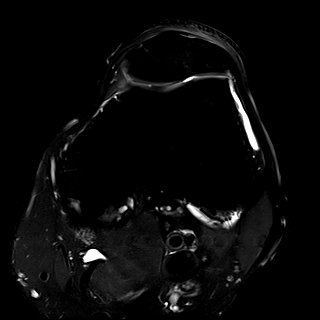
[im 28/32]
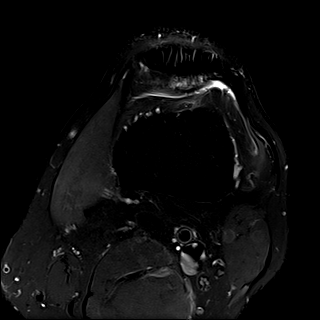
[im 32/32]
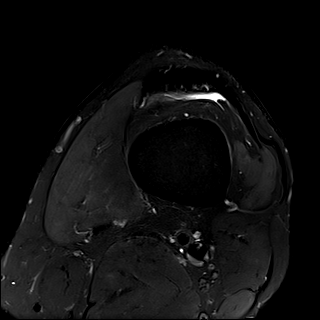

[Series 6: pd_sag_fs · sagittal · left · 3.0mm · 0.23mm/px · 4 of 29 slices shown]
[im 1/29]
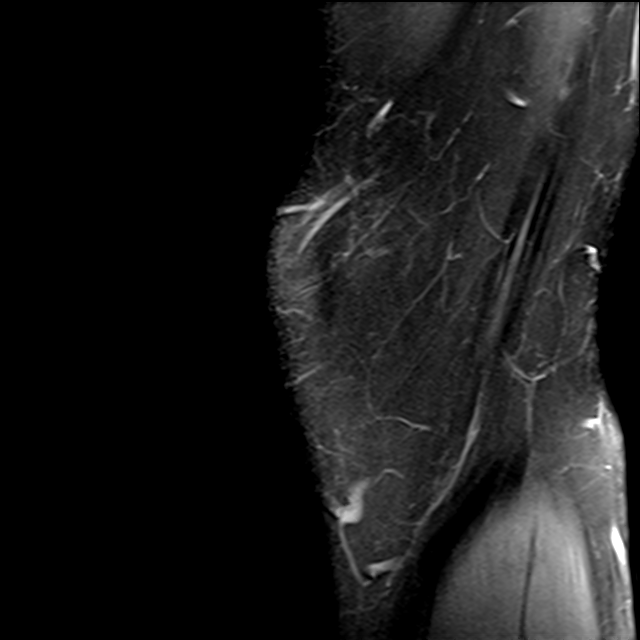
[im 5/29]
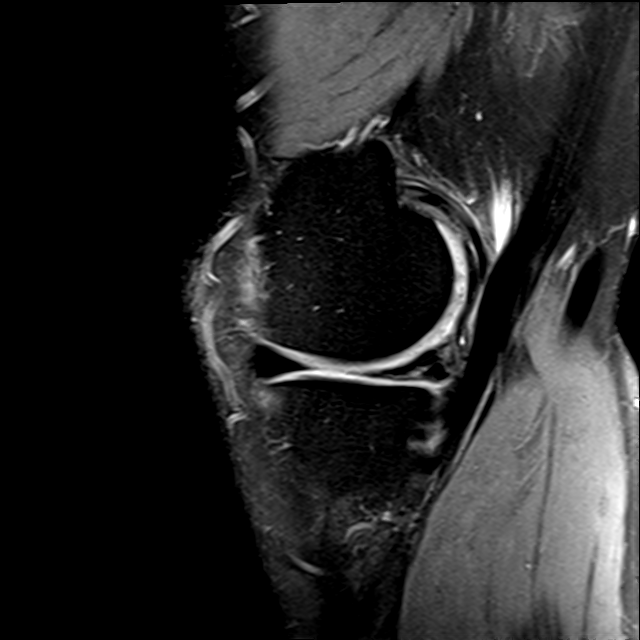
[im 17/29]
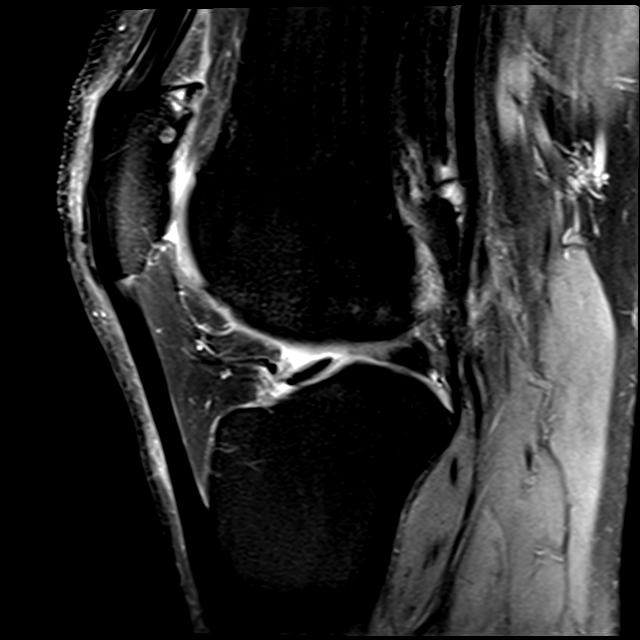
[im 25/29]
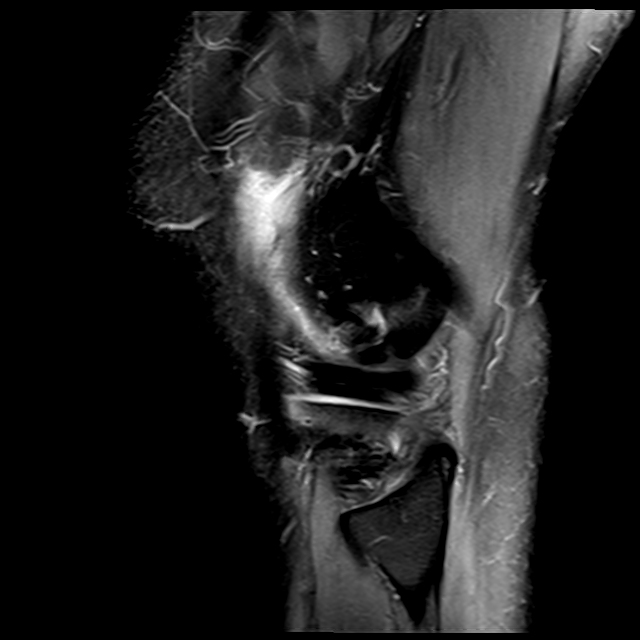

[Series 7: t2_sag_fs · sagittal · left · 3.0mm · 0.23mm/px · 3 of 29 slices shown]
[im 5/29]
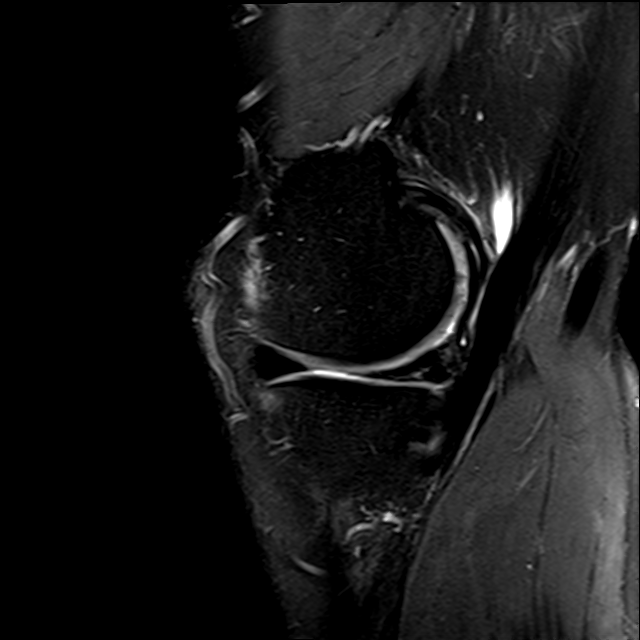
[im 17/29]
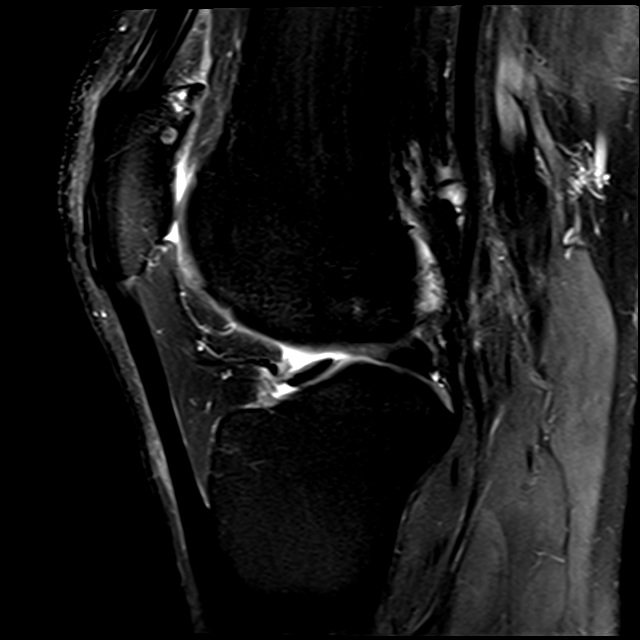
[im 25/29]
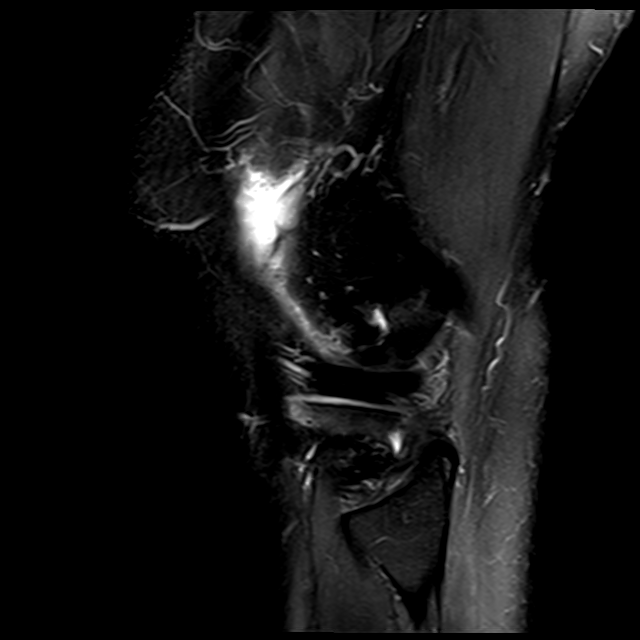

[Series 8: t1_cor · coronal · left · 3.5mm · 0.21mm/px · 3 of 25 slices shown]
[im 5/25]
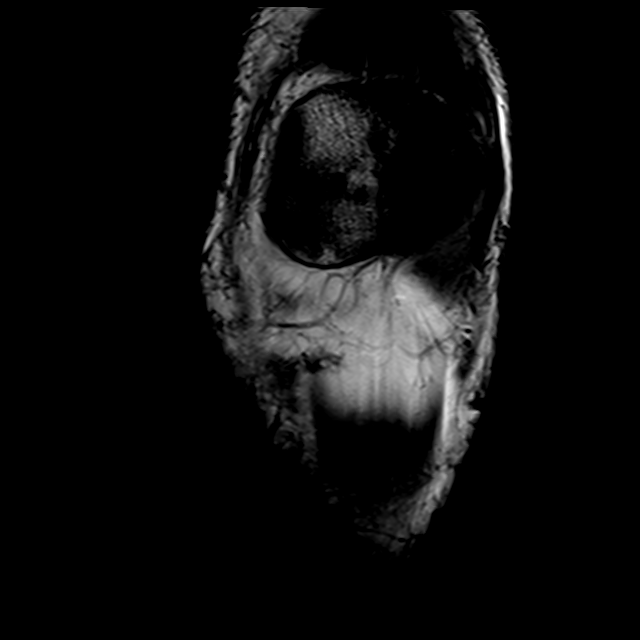
[im 13/25]
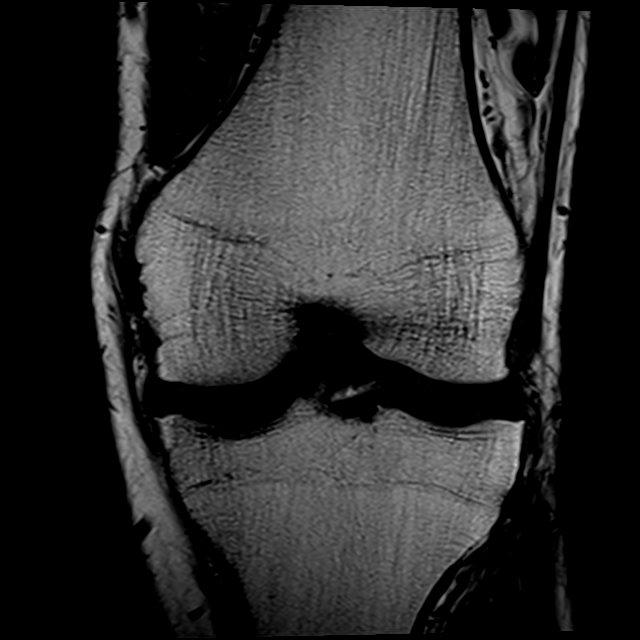
[im 21/25]
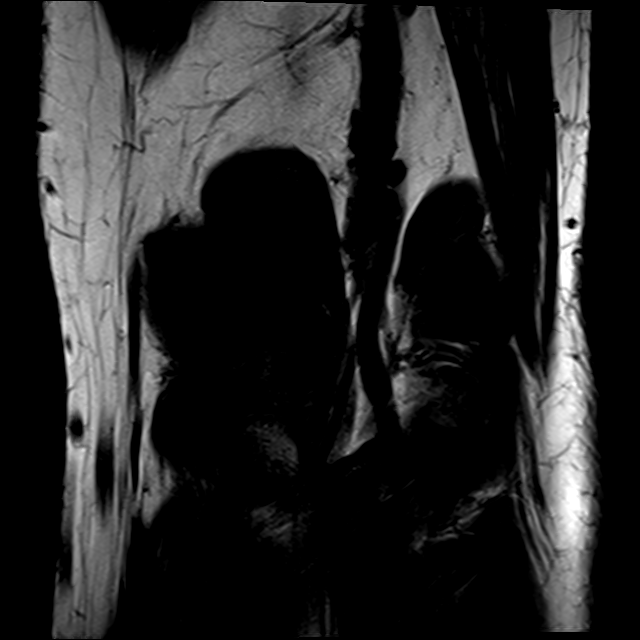

[18 of 40 positions shown; findings below may reference images not displayed]

FINDINGS: OSSEOUS: No acute fracture, avascular necrosis or aggressive osseous lesion.
MEDIAL COMPARTMENT:
Medial meniscus: Oblique inferior surface tear of the posterior horn extending into the body. Partial extrusion of the body into the medial gutter.
Articular cartilage: Mild chondral thinning and irregularity of the posterior weightbearing and posterior nonweightbearing medial femoral condyle. No focal full-thickness chondral defect or
underlying reactive osseous change.
LATERAL COMPARTMENT:
Lateral meniscus: Intact.
Articular cartilage: Intact.
PATELLOFEMORAL JOINT AND EXTENSOR MECHANISM:
Quadriceps tendon: The visualized portion of the distal quadriceps tendon is intact.
Patella tendon: Mild tendinosis.
Articular cartilage: Large region of irregular deep partial-thickness chondrosis along the median ridge and lateral patellar facet, with superimposed scattered full-thickness chondral fissuring and
multifocal underlying reactive subchondral cystic change and marrow edema.
Alignment: 15 x 9 mm region of deep partial-thickness and full-thickness chondrosis of the lateral femoral trochlea, without underlying reactive subchondral cystic change and marrow edema.
LIGAMENTS:
Anterior cruciate ligament: Intact.
Posterior cruciate ligament: Intact.
Medial collateral ligament: Intact.
Lateral collateral ligament: Intact.
MUSCULOTENDINOUS: The visualized musculotendinous soft tissues about the knee are unremarkable.
OTHER: Small knee joint effusion. Small popliteal fossa cyst.
IMPRESSION: 1.  Oblique inferior surface tear of the posterior horn extending into the body of the medial meniscus. Mild medial compartment chondrosis.
2.  Moderate patellofemoral joint chondrosis.
3.  Small knee joint effusion and small popliteal fossa cyst.

## 2022-05-11 IMAGING — CT CT LSPINE WO CONTRAST
3 series · 12 of 33 positions shown, 14 images · IV contrast (agent unspecified)
Comparison: Lumbar spine MRI without contrast 12/25/2021

Images Obtained from [facility]
REASON FOR EXAM: Radiculopathy, lumbar region. Patient reports history of lumbar spine fusion 9 years ago. Patient reports right leg symptoms.
TECHNIQUE: CT images of the lumbar spine were obtained without administration of intravenous contrast. Dose reduction technique used: Automated exposure control and adjustment of the mA and/or kV
according to patient size. Total radiation dose to patient is CTDIvol 24.20 mGy and DLP 598.00 mGy-cm.
. CT/Cardiac Nuclear Medicine exam count in previous 12-months: 1
CONTRAST: None

[Series 3: soft tissue · axial · 0.29mm/px · z∈[+1375,+1522]mm · 4 of 71 slices shown, 5 images]
[im 11/71  soft-tissue]
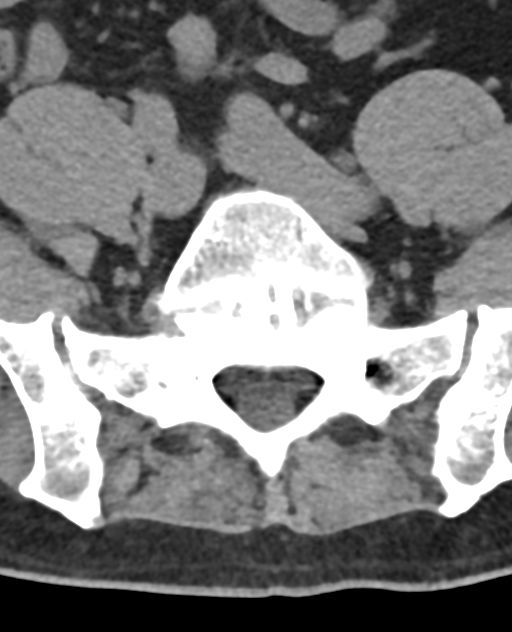
[im 11/71  bone]
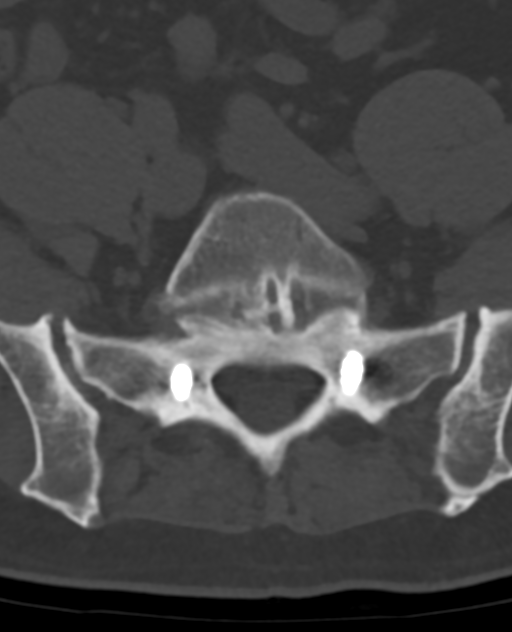
[im 27/71  bone]
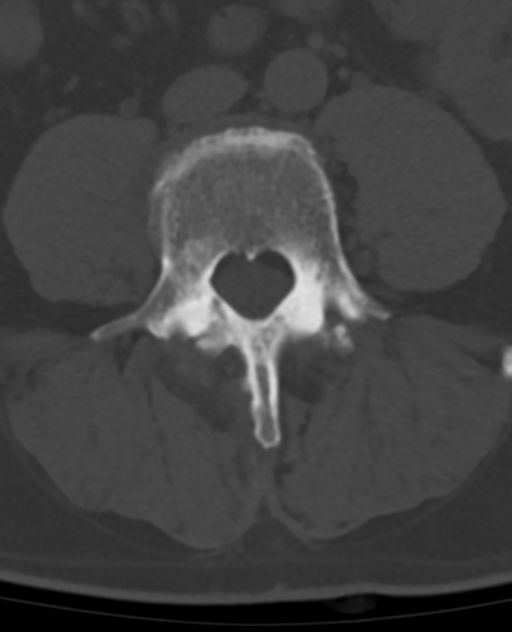
[im 44/71  bone]
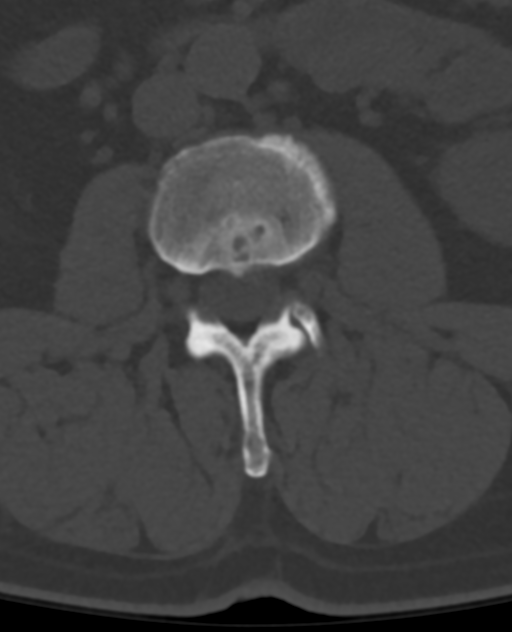
[im 60/71  bone]
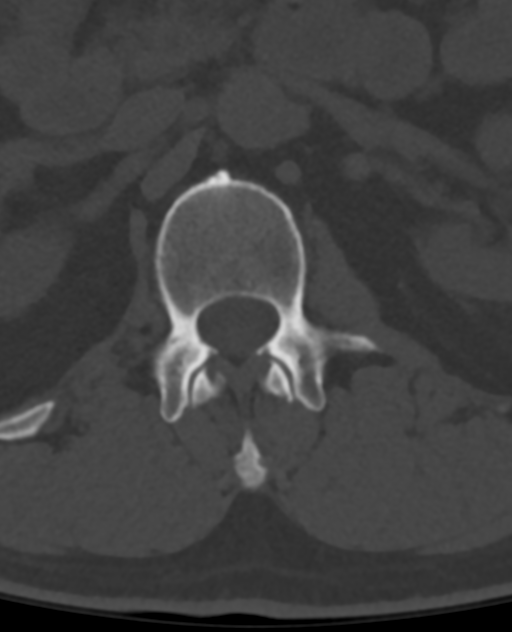

[Series 7: coronal · coronal · 0.29mm/px · 3 of 91 slices shown]
[im 19/91  bone]
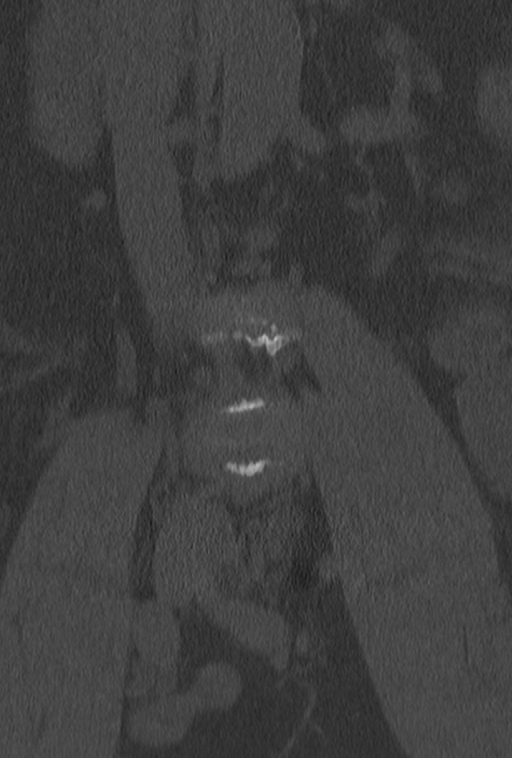
[im 37/91  bone]
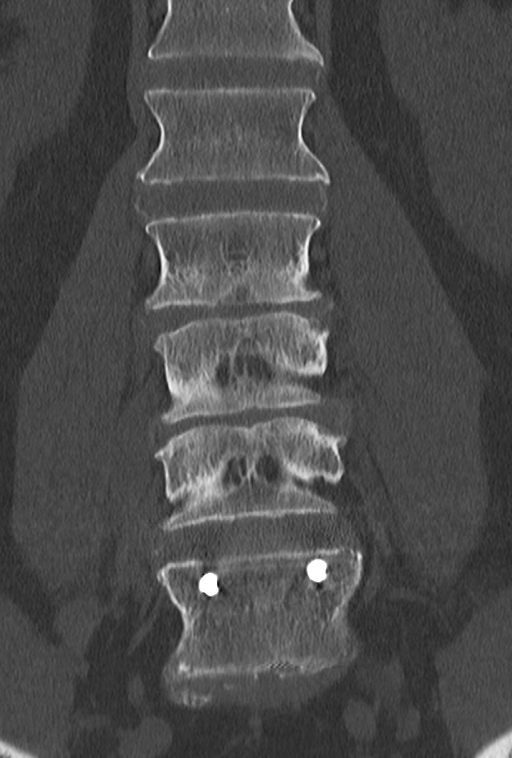
[im 55/91  bone]
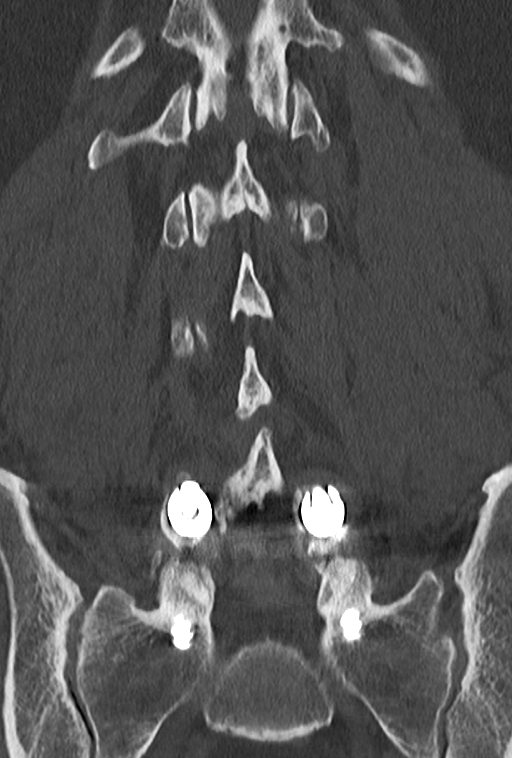

[Series 9: sagittal · sagittal · 0.36mm/px · 5 of 72 slices shown, 6 images]
[im 24/72  bone]
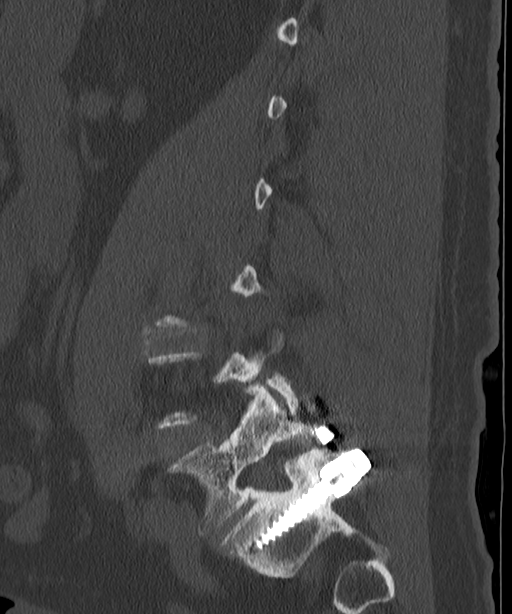
[im 30/72  bone]
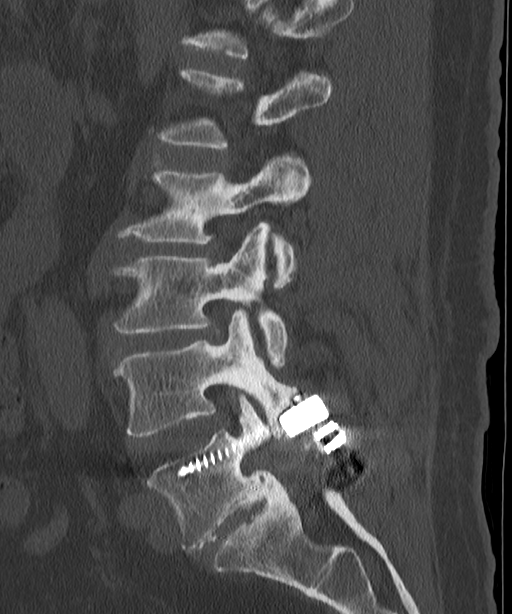
[im 36/72  soft-tissue]
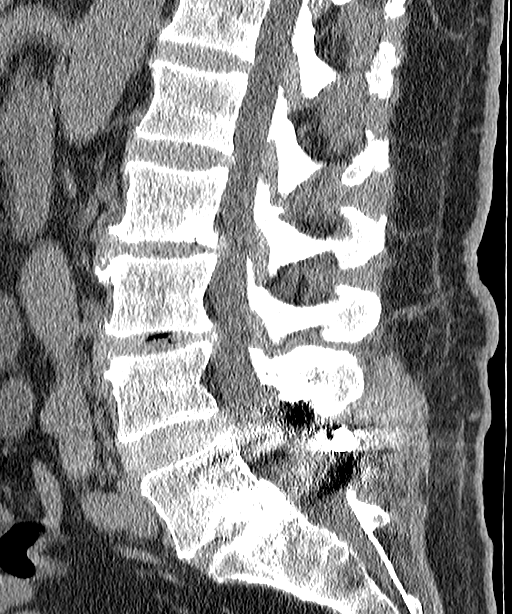
[im 36/72  bone]
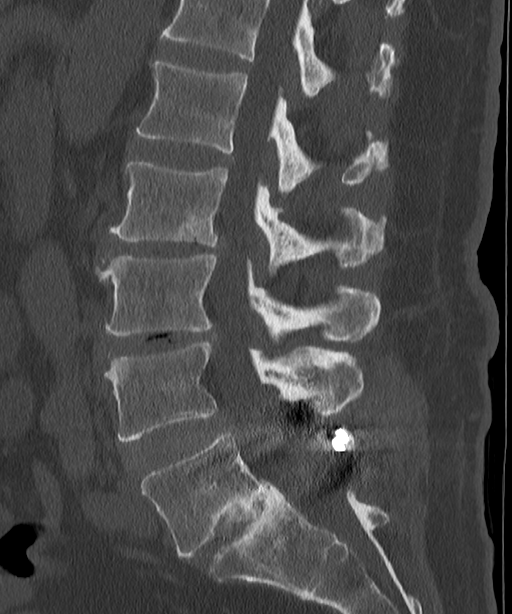
[im 42/72  bone]
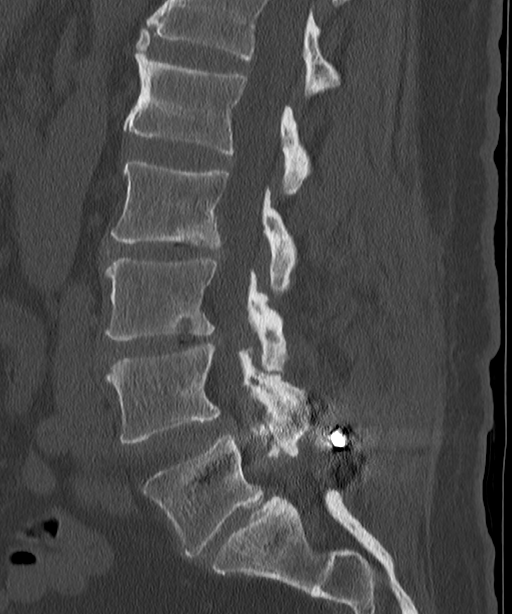
[im 48/72  bone]
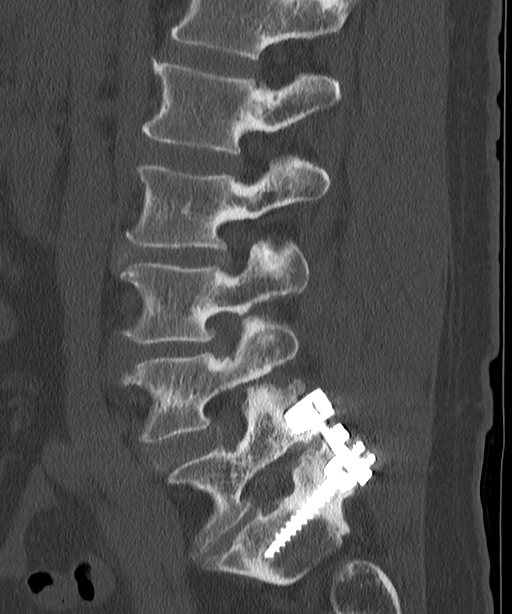

[12 of 33 positions shown; findings below may reference images not displayed]

FINDINGS: SPINAL LABELING: Please note that spinal labeling was performed assuming there are 5 non-rib-bearing lumbar type vertebrae.
ALIGNMENT: No subluxations.
VERTEBRAE:  Previous L5-S1 fusion. No significant perimetallic lucency or evidence of hardware migration or fracture. No acute fractures. Vertebral body heights are maintained.
INTERVERTEBRAL DISCS:  No significant disc height loss.
PARASPINAL SOFT TISSUES:  Allowing for metallic artifact and limitations of CT, no significant paraspinal soft tissue abnormalities.
FINDINGS BY LEVEL:
T12-L1: No high-grade osseous canal stenosis or foraminal narrowing.
L1-L2:  No high-grade osseous canal stenosis or foraminal narrowing.
L2-L3:  No high-grade osseous canal stenosis or foraminal narrowing.
L3-L4:  Severe facet arthropathy. Diffuse disc bulge. Ligamentum flavum hypertrophy. Endplate spurring. At least mild canal stenosis. At least moderate right and mild to moderate left foraminal
narrowing.
L4-L5:  Severe facet arthropathy. Diffuse disc bulge. Ligamentum flavum hypertrophy. Endplate spurring. At least mild bilateral foraminal narrowing. No high-grade osseous canal stenosis.
L5-S1:  No high-grade osseous canal stenosis or foraminal narrowing.
IMPRESSION: 1.  Previous L5-S1 fusion without interval hardware complication.
2.  At least mild L3-4 canal stenosis.
3.  At least moderate right and mild to moderate left L3-4 foraminal narrowing.
4.  At least mild bilateral L4-5 foraminal narrowing.
